# Patient Record
Sex: Male | Born: 1960
Health system: Southern US, Community
[De-identification: ages and names within clinical notes are randomized; demographics above are authoritative.]

## PROBLEM LIST (undated history)

## (undated) DIAGNOSIS — I480 Paroxysmal atrial fibrillation: Secondary | ICD-10-CM

## (undated) HISTORY — DX: Paroxysmal atrial fibrillation: I48.0

---

## 2004-01-29 ENCOUNTER — Ambulatory Visit (HOSPITAL_COMMUNITY): Admission: RE | Admit: 2004-01-29 | Discharge: 2004-01-29 | Payer: Self-pay | Admitting: Orthopedic Surgery

## 2008-05-09 ENCOUNTER — Encounter: Admission: RE | Admit: 2008-05-09 | Discharge: 2008-05-09 | Payer: Self-pay | Admitting: Gastroenterology

## 2009-11-14 IMAGING — CT CT ABD-PELV W/O CM
4 of 7 series · 14 of 32 positions shown, 19 images · IV contrast (CONTRAST)
Comparison: NONE

CLINICAL DATA: Bloating. 

CT ABDOMEN AND PELVIS WITHOUT AND WITH INTRAVENOUS AND FOLLOWING 
ORAL  CONTRAST
TECHNIQUE: Multiple axial images were obtained from the diaphragm 
through the pelvis.

[Series 2: wo · axial · 0.82mm/px · z∈[+886,+966]mm · 2 of 49 slices shown]
[im 17/49  soft-tissue]
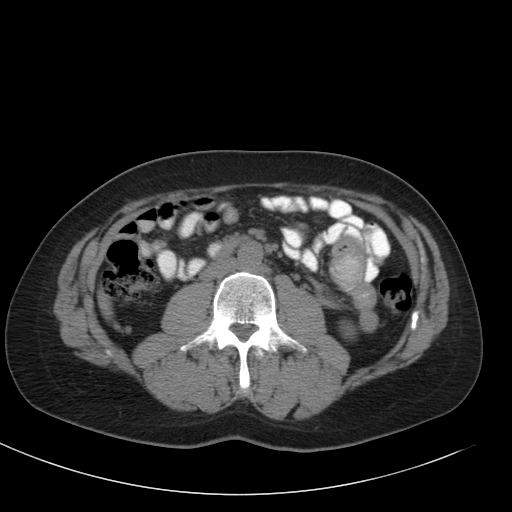
[im 33/49  soft-tissue]
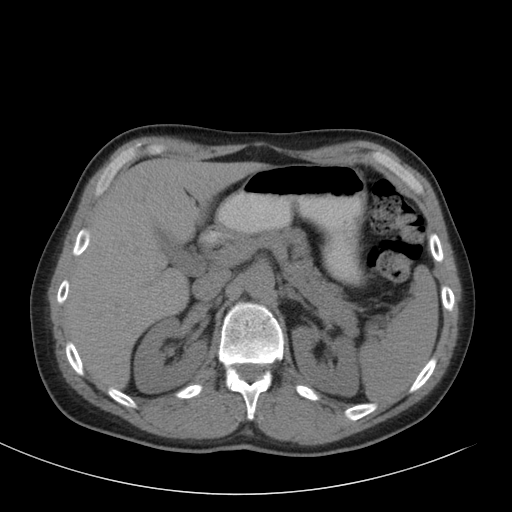

[Series 3: arterial · axial · arterial · 0.83mm/px · z∈[+886,+966]mm · 2 of 49 slices shown]
[im 17/49  soft-tissue]
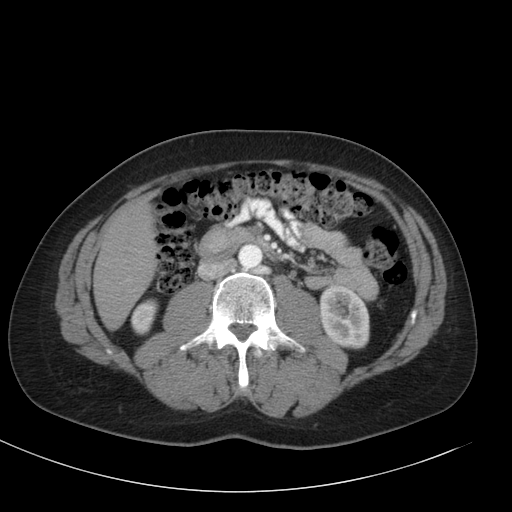
[im 33/49  soft-tissue]
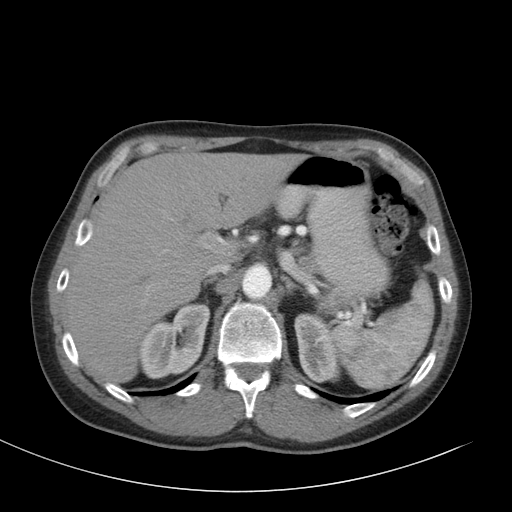

[Series 4: venous · axial · portal-venous · 0.82mm/px · z∈[+651,+976]mm · 6 of 93 slices shown, 11 images]
[im 14/93  soft-tissue]
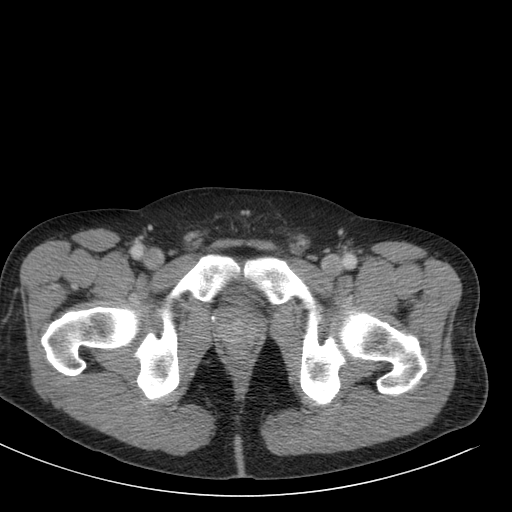
[im 14/93  bone]
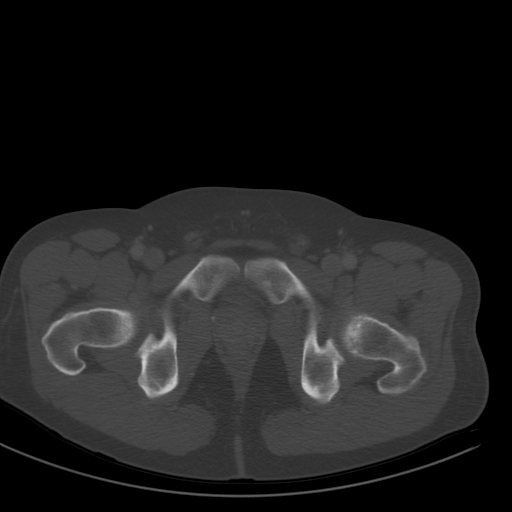
[im 27/93  soft-tissue]
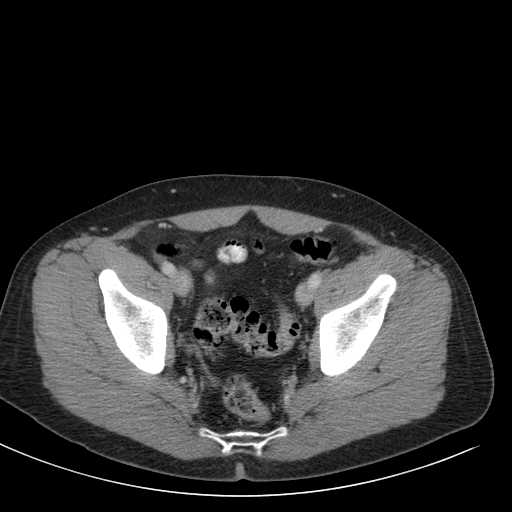
[im 40/93  soft-tissue]
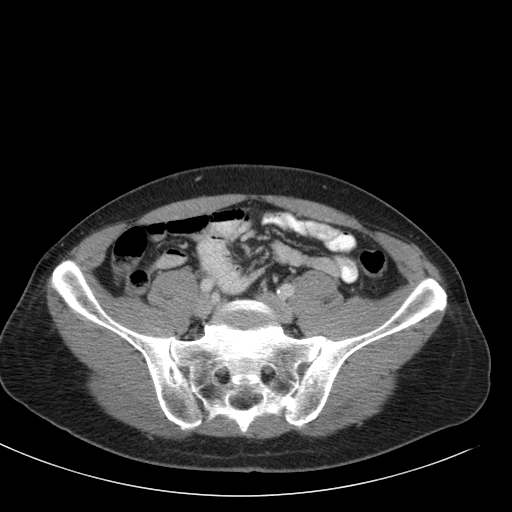
[im 40/93  lung]
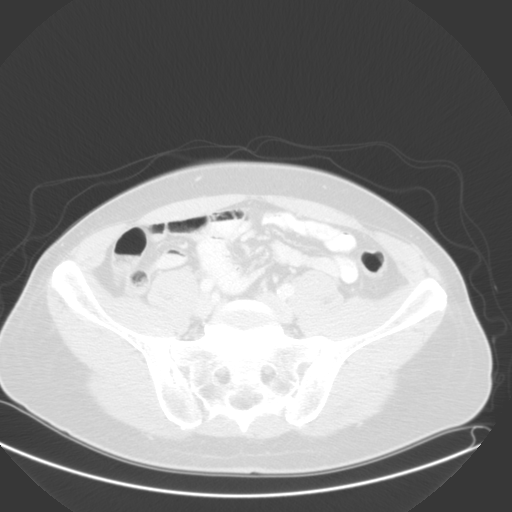
[im 53/93  soft-tissue]
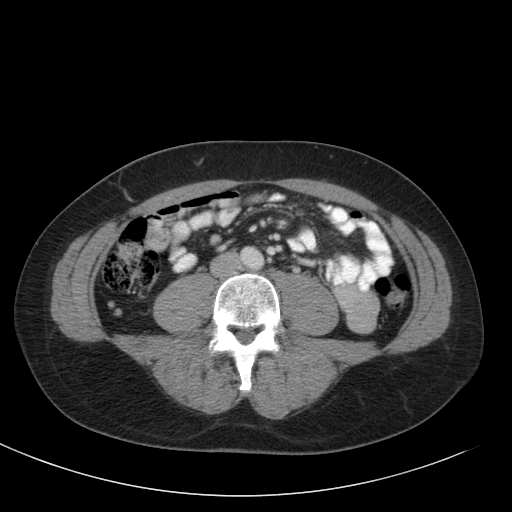
[im 53/93  lung]
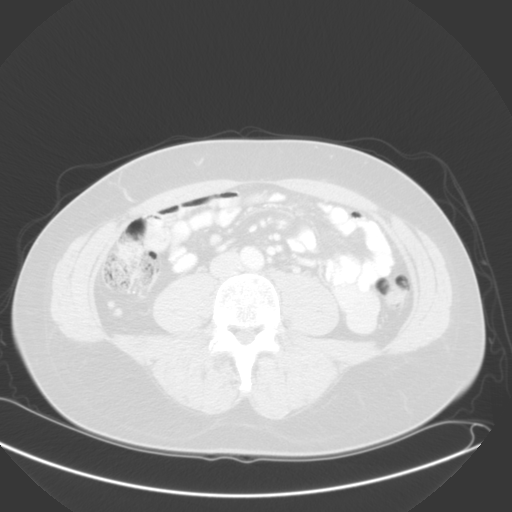
[im 66/93  soft-tissue]
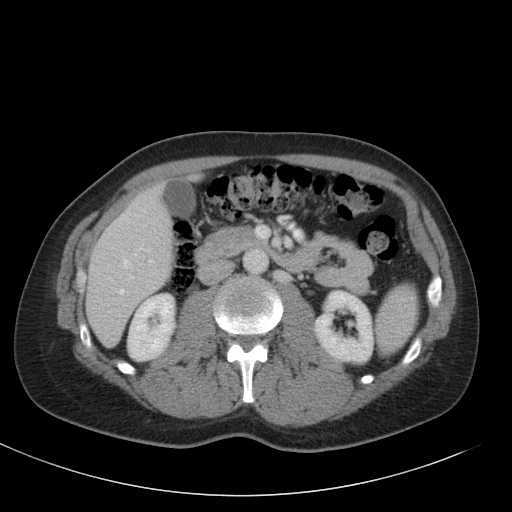
[im 66/93  lung]
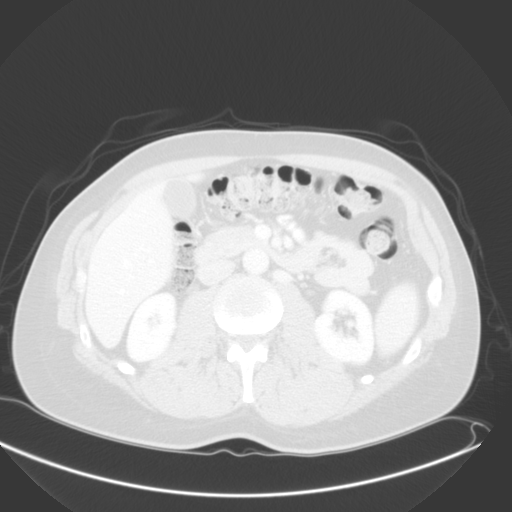
[im 79/93  soft-tissue]
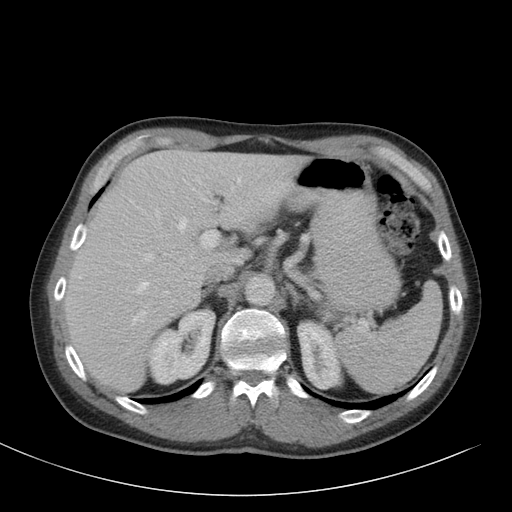
[im 79/93  lung]
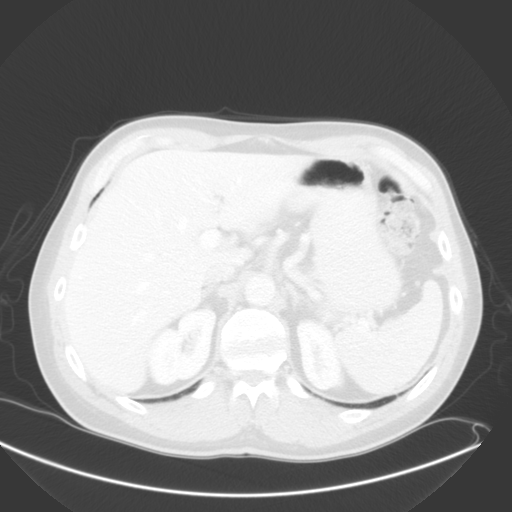

[Series 8: bone · axial · 0.82mm/px · z∈[+651,+846]mm · 4 of 93 slices shown]
[im 14/93  bone]
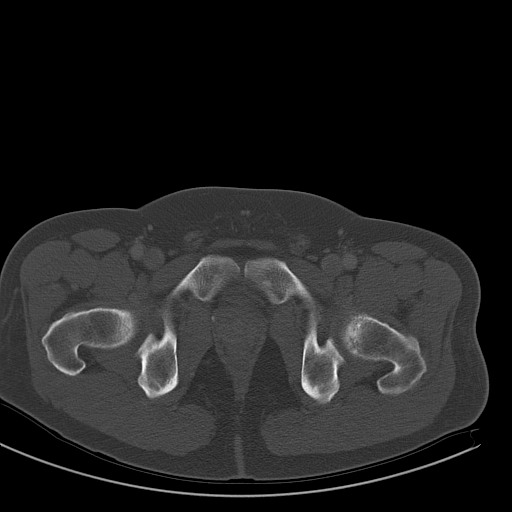
[im 27/93  bone]
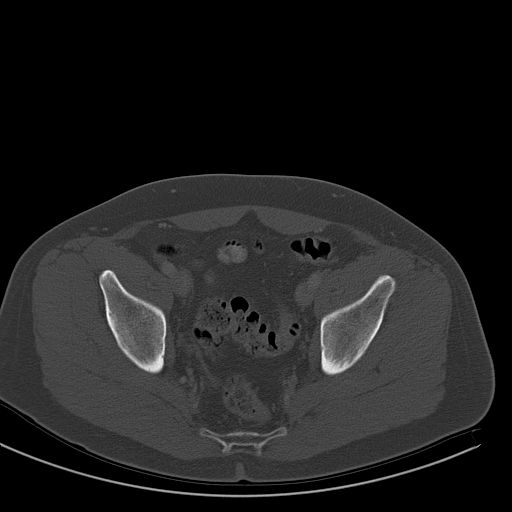
[im 40/93  bone]
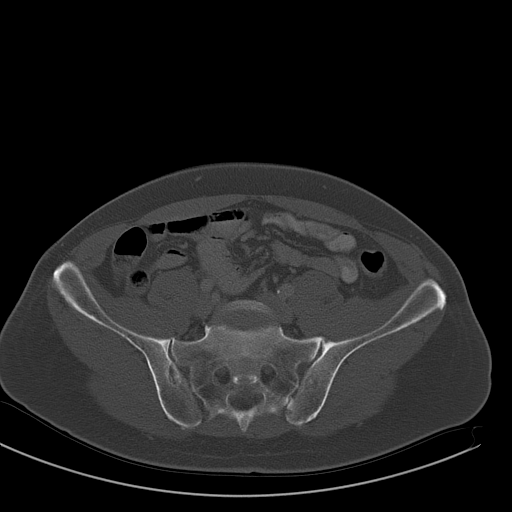
[im 53/93  bone]
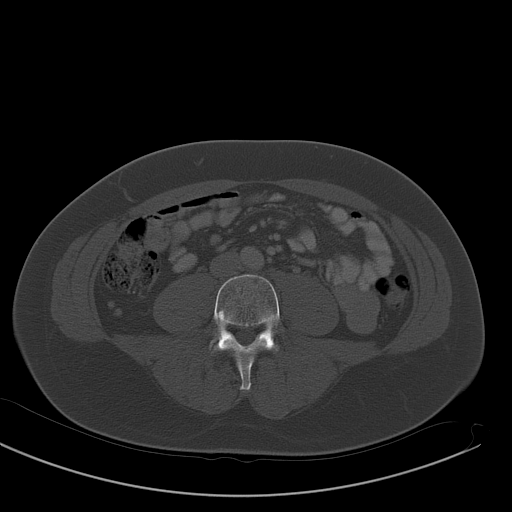

[14 of 32 positions shown; findings below may reference images not displayed]

FINDINGS: The liver, gallbladder, spleen and pancreas are 
unremarkable. There is no evidence of upper abdominal or 
retroperitoneal mass, adenopathy, or aneurysm. No adrenal or renal 
mass or obstruction is noted. No bowel, mesenteric, pelvic, or 
inguinal mass, adenopathy or inflammatory process. No evidence of 
appendicitis, diverticulitis, hernia or bowel obstruction. No lung 
base mass, infiltrate, edema or effusion. No lytic or blastic 
lesions are identified. No evidence of renal or ureteral calculi.
IMPRESSION: Negative CT of the abdomen and pelvis.  No evidence 
of mass, adenopathy or inflammatory process. Renzhe, Jhon Rusly Date:  10/28/2007 DAS  JLM

## 2014-05-22 ENCOUNTER — Encounter: Payer: Self-pay | Admitting: Cardiovascular Disease

## 2014-05-22 ENCOUNTER — Ambulatory Visit (INDEPENDENT_AMBULATORY_CARE_PROVIDER_SITE_OTHER): Payer: BLUE CROSS/BLUE SHIELD | Admitting: Cardiovascular Disease

## 2014-05-22 VITALS — BP 112/74 | HR 82 | Ht 76.0 in | Wt 244.0 lb

## 2014-05-22 DIAGNOSIS — I480 Paroxysmal atrial fibrillation: Secondary | ICD-10-CM | POA: Insufficient documentation

## 2014-05-22 DIAGNOSIS — R0602 Shortness of breath: Secondary | ICD-10-CM

## 2014-05-22 DIAGNOSIS — I4891 Unspecified atrial fibrillation: Secondary | ICD-10-CM

## 2014-05-22 DIAGNOSIS — R0789 Other chest pain: Secondary | ICD-10-CM

## 2014-05-22 HISTORY — DX: Paroxysmal atrial fibrillation: I48.0

## 2014-05-22 NOTE — Patient Instructions (Signed)
Your physician has requested that you have an echocardiogram. Echocardiography is a painless test that uses sound waves to create images of your heart. It provides your doctor with information about the size and shape of your heart and how well your heart's chambers and valves are working. This procedure takes approximately one hour. There are no restrictions for this procedure.  Your physician has requested that you have an exercise tolerance test. For further information please visit [email or web address]. Please also follow instruction sheet, as given.  Dr. Royann Shiversroitoru recommends that you schedule a follow-up appointment in: One year.

## 2014-05-22 NOTE — Progress Notes (Signed)
Patient ID: Kevin [name], male   DOB: 17-Feb-1961, 54 y.o.   MRN: [id number]      Reason for office visit New-onset paroxysmal atrial fibrillation  Kevin [name] is a healthy 54 year old man with recently diagnosed atrial fibrillation. This manifested as rapid uncomfortable palpitations while driving. He first noticed it one week ago. It lasted for less than an hour and recurred 2 days later. His electrocardiogram in Kevin [name] office showed atrial fibrillation with a ventricular rate of 115 bpm but is otherwise normal. He has a long-standing history of very brief palpitations usually lasting less than 2 seconds, and these are the first episodes of more sustained palpitations.  No other cardiac complaints occurred while he was having palpitations.  He has no history of structural heart disease and does not have hypertension, diabetes, a history of stroke/TIA, bleeding problems or, congestive heart failure. He had strep throat a couple of times as a young adult but was treated with antibiotics. He has good functional status. He bears a diagnosis of hyperlipidemia but does not tolerate statins due to headache and stopped taking them. He also bears a diagnosis of hiatal hernia irritable bowel syndrome and asthma.  He does not regularly exercise, but as recently as 6 months ago was riding his mountain bike for up to 10 miles. He drinks alcohol socially, no more than 1-2 beers a night. He did not have any alcohol when he had his episode of prolonged palpitations. He scores only one point on the Epworth sleepiness scale.   His mother had atrial fibrillation when she was 53 years old. He has a brother 80 years old who has frequent palpitations, but has not been formally diagnosed with an arrhythmia.   No Known Allergies  Current Outpatient Prescriptions  Medication Sig Dispense Refill  . aspirin EC 81 MG tablet Take 81 mg by mouth daily.    Marland Kitchen diltiazem (CARDIZEM) 60 MG tablet Take 60 mg by  mouth 3 (three) times daily.  0  . Multiple Vitamin (MULTIVITAMIN WITH MINERALS) TABS tablet Take 1 tablet by mouth daily.     No current facility-administered medications for this visit.    Past Medical History  Diagnosis Date  . Paroxysmal atrial fibrillation 05/22/2014    History reviewed. No pertinent past surgical history.  Family History  Problem Relation Age of Onset  . Heart attack Father 82  . Diabetes Sister     History   Social History  . Marital Status: Married    Spouse Name: N/A  . Number of Children: N/A  . Years of Education: N/A   Occupational History  . Not on file.   Social History Main Topics  . Smoking status: Former Smoker    Types: Cigarettes, Cigars    Quit date: 05/22/1999  . Smokeless tobacco: Not on file  . Alcohol Use: 0.0 oz/week    0 Standard drinks or equivalent per week  . Drug Use: No  . Sexual Activity: Not on file   Other Topics Concern  . Not on file   Social History Narrative  . No narrative on file    Review of systems: The patient specifically denies any chest pain at rest or with exertion, dyspnea at rest or with exertion, orthopnea, paroxysmal nocturnal dyspnea, syncope, focal neurological deficits, intermittent claudication, lower extremity edema, unexplained weight gain, cough, hemoptysis or wheezing.  The patient also denies abdominal pain, nausea, vomiting, dysphagia, diarrhea, constipation, polyuria, polydipsia, dysuria, hematuria, frequency, urgency, abnormal bleeding or bruising, fever,  chills, unexpected weight changes, mood swings, change in skin or hair texture, change in voice quality, auditory or visual problems, allergic reactions or rashes, new musculoskeletal complaints other than usual "aches and pains".   PHYSICAL EXAM BP 112/74 mmHg  Pulse 82  Ht 6\' 4"  (1.93 m)  Wt 244 lb (110.678 kg)  BMI 29.71 kg/m2  General: Alert, oriented x3, no distress Head: no evidence of trauma, PERRL, EOMI, no exophtalmos  or lid lag, no myxedema, no xanthelasma; normal ears, nose and oropharynx Neck: normal jugular venous pulsations and no hepatojugular reflux; brisk carotid pulses without delay and no carotid bruits Chest: clear to auscultation, no signs of consolidation by percussion or palpation, normal fremitus, symmetrical and full respiratory excursions Cardiovascular: normal position and quality of the apical impulse, regular rhythm, normal first and second heart sounds, no murmurs, rubs or gallops. He seems to have a distinct presystolic click - it does not sound like a typical opening snap and there is no accompanying diastolic rumble Abdomen: no tenderness or distention, no masses by palpation, no abnormal pulsatility or arterial bruits, normal bowel sounds, no hepatosplenomegaly Extremities: no clubbing, cyanosis or edema; 2+ radial, ulnar and brachial pulses bilaterally; 2+ right femoral, posterior tibial and dorsalis pedis pulses; 2+ left femoral, posterior tibial and dorsalis pedis pulses; no subclavian or femoral bruits Neurological: grossly nonfocal   EKG: Normal sinus rhythm, normal tracing today, QTC 426 ms; ECG and Dr. Lanell MatarAronson's office clearly shows atrial fibrillation with rapid ventricular response  Lipid Panel  No results found for: CHOL, TRIG, HDL, CHOLHDL, VLDL, LDLCALC, LDLDIRECT  BMET No results found for: NA, K, CL, CO2, GLUCOSE, BUN, CREATININE, CALCIUM, GFRNONAA, GFRAA   ASSESSMENT AND PLAN  New onset atrial fibrillation in a young man without history of heart disease and without obvious evidence of structural abnormalities by physical exam. Normal ECG while in sinus rhythm. Low suspicion for obstructive sleep apnea. There is a family history of atrial fibrillation, but at much more advanced stages. Plan echocardiogram and treadmill stress test. CHADSVasc score = 0. For the time being the treatment with aspirin and diltiazem initiated by Dr. Jacky KindleAronson appears perfectly appropriate. Will  change therapeutic plan accordingly depending on results of stress test and echo. Weight loss may be beneficial to reduce the likelihood of arrhythmia recurrence. Lab results should include thyroid function tests, [facility]  Orders Placed This Encounter  Procedures  . EKG 12-Lead  . Exercise Tolerance Test  . 2D Echocardiogram without contrast   Meds ordered this encounter  Medications  . diltiazem (CARDIZEM) 60 MG tablet    Sig: Take 60 mg by mouth 3 (three) times daily.    Refill:  0  . Multiple Vitamin (MULTIVITAMIN WITH MINERALS) TABS tablet    Sig: Take 1 tablet by mouth daily.  Marland Kitchen. aspirin EC 81 MG tablet    Sig: Take 81 mg by mouth daily.    Junious SilkROITORU,Jacklyn Branan  Walter Min, MD, [facility] [phone] office [phone] pager

## 2014-05-25 ENCOUNTER — Encounter: Payer: Self-pay | Admitting: Cardiovascular Disease

## 2014-05-30 ENCOUNTER — Encounter (HOSPITAL_COMMUNITY): Payer: BLUE CROSS/BLUE SHIELD

## 2014-05-30 ENCOUNTER — Ambulatory Visit (HOSPITAL_COMMUNITY): Payer: BLUE CROSS/BLUE SHIELD

## 2014-06-02 ENCOUNTER — Encounter (HOSPITAL_COMMUNITY): Payer: Self-pay | Admitting: *Deleted

## 2014-06-09 ENCOUNTER — Telehealth (HOSPITAL_COMMUNITY): Payer: Self-pay

## 2014-06-09 NOTE — Telephone Encounter (Signed)
Encounter complete. 

## 2014-06-14 ENCOUNTER — Ambulatory Visit (HOSPITAL_COMMUNITY)
Admission: RE | Admit: 2014-06-14 | Discharge: 2014-06-14 | Disposition: A | Discharge status: Home / Self Care | Payer: BLUE CROSS/BLUE SHIELD | Source: Ambulatory Visit | Attending: Cardiovascular Disease | Admitting: Cardiovascular Disease

## 2014-06-14 ENCOUNTER — Ambulatory Visit (HOSPITAL_BASED_OUTPATIENT_CLINIC_OR_DEPARTMENT_OTHER)
Admission: RE | Admit: 2014-06-14 | Discharge: 2014-06-14 | Disposition: A | Discharge status: Home / Self Care | Payer: BLUE CROSS/BLUE SHIELD | Source: Ambulatory Visit | Attending: Cardiovascular Disease | Admitting: Cardiovascular Disease

## 2014-06-14 DIAGNOSIS — I4891 Unspecified atrial fibrillation: Secondary | ICD-10-CM

## 2014-06-14 DIAGNOSIS — R0600 Dyspnea, unspecified: Secondary | ICD-10-CM | POA: Insufficient documentation

## 2014-06-14 DIAGNOSIS — R0602 Shortness of breath: Secondary | ICD-10-CM | POA: Diagnosis not present

## 2014-06-14 NOTE — Procedures (Signed)
Exercise Treadmill Test Pre-Exercise Testing Evaluation Rhythm: normal sinus                  Test  Exercise Tolerance Test Ordering MD: Thurmon FairMihai Croitoru, MD    Unique Test No: 1  Treadmill:  1  Indication for ETT: exertional dyspnea  Contraindication to ETT: No   Stress Modality: exercise - treadmill  Cardiac Imaging Performed: non   Protocol: standard Bruce - maximal  Max BP:  173/87  Max MPHR (bpm):  168 85% MPR (bpm):  142  MPHR obtained (bpm):  181 % MPHR obtained:  107  Reached 85% MPHR (min:sec):  3:30 Total Exercise Time (min-sec):  9  Workload in METS:  10.1 Borg Scale: 15  Reason ETT Terminated:  THR obtained, SOB and mild leg fatigue    ST Segment Analysis At Rest: normal ST segments - no evidence of significant ST depression With Exercise: no evidence of significant ST depression  Other Information Arrhythmia:  No Angina during ETT:  absent (0) Quality of ETT:  diagnostic  ETT Interpretation:  normal - no evidence of ischemia by ST analysis  Comments: Nl GXT  Recommendations: Follow up with Dr. Royann Shiversroitoru

## 2014-06-14 NOTE — Progress Notes (Signed)
2D Echo Performed 06/14/2014    Eldonna Neuenfeldt, RCS  

## 2014-06-15 ENCOUNTER — Telehealth: Payer: Self-pay | Admitting: Cardiovascular Disease

## 2014-06-15 NOTE — Telephone Encounter (Signed)
Pt called in wanting to know if he is still suppose to take his Cardizem. Please call back

## 2014-06-15 NOTE — Telephone Encounter (Signed)
Spoke with pt, he will cont to take the cardizem per dr croitoru's office note and the echo and GXT were normal.

## 2015-08-08 DIAGNOSIS — Z79899 Other long term (current) drug therapy: Secondary | ICD-10-CM | POA: Diagnosis not present

## 2015-08-08 DIAGNOSIS — R300 Dysuria: Secondary | ICD-10-CM | POA: Diagnosis not present

## 2015-08-28 DIAGNOSIS — S8001XA Contusion of right knee, initial encounter: Secondary | ICD-10-CM | POA: Diagnosis not present

## 2015-10-15 DIAGNOSIS — S83241A Other tear of medial meniscus, current injury, right knee, initial encounter: Secondary | ICD-10-CM | POA: Diagnosis not present

## 2015-10-22 DIAGNOSIS — H5213 Myopia, bilateral: Secondary | ICD-10-CM | POA: Diagnosis not present

## 2015-11-05 DIAGNOSIS — S83241D Other tear of medial meniscus, current injury, right knee, subsequent encounter: Secondary | ICD-10-CM | POA: Diagnosis not present

## 2015-11-12 DIAGNOSIS — S83241D Other tear of medial meniscus, current injury, right knee, subsequent encounter: Secondary | ICD-10-CM | POA: Diagnosis not present

## 2015-11-15 ENCOUNTER — Encounter: Payer: Self-pay | Admitting: Cardiovascular Disease

## 2015-11-15 ENCOUNTER — Telehealth: Payer: Self-pay

## 2015-11-15 NOTE — Telephone Encounter (Signed)
Request for surgical clearance:   1. What type surgery is being performed? Right knee scope  2. When is this surgery scheduled? pending  3. Are there any medications that need to be held prior to surgery and how long? n/a  4. Name of the physician performing surgery: Dr Frederico Hammananiel Caffrey  5. What is the office phone and fax number?   Phone [phone]) [phone]  Fax [phone]

## 2015-11-15 NOTE — Telephone Encounter (Signed)
Letter sent via Epic. Probably time for a follow-up for him as well, not urgent.

## 2015-11-15 NOTE — Telephone Encounter (Signed)
error 

## 2015-11-20 NOTE — Telephone Encounter (Signed)
[facility]

## 2015-12-05 ENCOUNTER — Ambulatory Visit (INDEPENDENT_AMBULATORY_CARE_PROVIDER_SITE_OTHER): Payer: BLUE CROSS/BLUE SHIELD | Admitting: Cardiovascular Disease

## 2015-12-05 ENCOUNTER — Encounter: Payer: Self-pay | Admitting: Cardiovascular Disease

## 2015-12-05 VITALS — BP 129/78 | HR 81 | Ht 76.0 in | Wt 244.8 lb

## 2015-12-05 DIAGNOSIS — E663 Overweight: Secondary | ICD-10-CM | POA: Insufficient documentation

## 2015-12-05 DIAGNOSIS — I480 Paroxysmal atrial fibrillation: Secondary | ICD-10-CM | POA: Diagnosis not present

## 2015-12-05 NOTE — Progress Notes (Signed)
Cardiology Office Note    Date:  12/05/2015   ID:  Kevin [name], DOB March 14, 1961, MRN [id number]  PCP:  Minda Meo, MD  Cardiologist:   Thurmon Fair, MD   Chief Complaint  Patient presents with  . Follow-up    Pt states sob minor, randomly. Pt states no other Sx.     History of Present Illness:  Kevin [name] is a 55 y.o. male with paroxysmal atrial fibrillation with rapid ventricular response. His episodes have been [name] and far between. He estimates he has had 6 or 7 events in the last 2 years and usually each episode only lasts for matter of several seconds. He does not have any evidence of structural heart disease by previous echocardiogram and stress testing. He takes diltiazem for rate control. This medicine is causing a little bit of problem with ankle edema and especially with constipation, although he has difficulty distinguishing the effects of irritable bowel syndrome from medication side effects. On a couple of occasions when he has accidentally skipped the diltiazem he has felt better. Things seem to "flow easier". He denies angina, dyspnea, syncope, focal neurological deficits, visual changes, headaches or claudication. He reports gaining weight since his last appointment but according to our computer his weight is identical.    Past Medical History:  Diagnosis Date  . Paroxysmal atrial fibrillation (HCC) 05/22/2014    No past surgical history  Current Medications: Outpatient Medications Prior to Visit  Medication Sig Dispense Refill  . aspirin EC 81 MG tablet Take 81 mg by mouth daily.    . Multiple Vitamin (MULTIVITAMIN WITH MINERALS) TABS tablet Take 1 tablet by mouth daily.    Marland Kitchen diltiazem (CARDIZEM) 60 MG tablet Take 60 mg by mouth 3 (three) times daily.  0   No facility-administered medications prior to visit.      Allergies:   Review of patient's allergies indicates no known allergies.   Social History   Social History  . Marital status:  Married    Spouse name: N/A  . Number of children: N/A  . Years of education: N/A   Social History Main Topics  . Smoking status: Former Smoker    Types: Cigarettes, Cigars    Quit date: 05/22/1999  . Smokeless tobacco: None  . Alcohol use 0.0 oz/week  . Drug use: No  . Sexual activity: Not Asked   Other Topics Concern  . None   Social History Narrative  . None     Family History:  The patient's family history includes Diabetes in his sister; Heart attack (age of onset: 62) in his father.   ROS:   Please see the history of present illness.    ROS All other systems reviewed and are negative.   PHYSICAL EXAM:   VS:  BP 129/78   Pulse 81   Ht 6\' 4"  (1.93 m)   Wt 244 lb 12.8 oz (111 kg)   BMI 29.80 kg/m    GEN: Well nourished, well developed, in no acute distress  HEENT: normal  Neck: no JVD, carotid bruits, or masses Cardiac: RRR; no murmurs, rubs, or gallops,no edema  Respiratory:  clear to auscultation bilaterally, normal work of breathing GI: soft, nontender, nondistended, + BS MS: no deformity or atrophy  Skin: warm and dry, no rash Neuro:  Alert and Oriented x 3, Strength and sensation are intact Psych: euthymic mood, full affect  Wt Readings from Last 3 Encounters:  12/05/15 244 lb 12.8 oz (111 kg)  05/22/14 244 lb (110.7 kg)      Studies/Labs Reviewed:   EKG:  EKG is ordered today.  The ekg ordered today demonstrates NSR.     ASSESSMENT:    1. Paroxysmal atrial fibrillation (HCC)      PLAN:  In order of problems listed above:  1. AFib: He seems to have "lone atrial fibrillation" and the episodes are [name] and brief. At this point the rate control medication seems to be having more side effects than benefit 9 think he can discontinue it. Since his symptoms are so infrequent and mild antiarrhythmics are also not indicated. CHADSVasc zero. Embolic risk is low, continue aspirin 2. Overweight: Encouraged weight loss. Discussed the direct relationship  between weight and burden of atrial fibrillation. He does not have symptoms suggestive of obstructive sleep apnea    Medication Adjustments/Labs and Tests Ordered: Current medicines are reviewed at length with the patient today.  Concerns regarding medicines are outlined above.  Medication changes, Labs and Tests ordered today are listed in the Patient Instructions below. Patient Instructions  Dr Royann Shiversroitoru has recommended making the following medication changes: 1. STOP Diltiazem  Dr C recommends that you schedule a follow-up appointment in 1 year. You will receive a reminder letter in the mail two months in advance. If you don't receive a letter, please call our office to schedule the follow-up appointment.  If you need a refill on your cardiac medications before your next appointment, please call your pharmacy.    Signed, Thurmon FairMihai Jazzlyn Huizenga, MD  12/05/2015 5:36 PM    [facility] [address], [place], [place]  [address] Phone: [phone]) [phone]; Fax: [phone]) [phone]

## 2015-12-05 NOTE — Patient Instructions (Signed)
Dr Croitoru has recommended making the following medication changes: 1. STOP Diltiazem  Dr C recommends that you schedule a follow-up appointment in 1 year. You will receive a reminder letter in the mail two months in advance. If you don't receive a letter, please call our office to schedule the follow-up appointment.  If you need a refill on your cardiac medications before your next appointment, please call your pharmacy. 

## 2015-12-24 DIAGNOSIS — E784 Other hyperlipidemia: Secondary | ICD-10-CM | POA: Diagnosis not present

## 2015-12-24 DIAGNOSIS — Z125 Encounter for screening for malignant neoplasm of prostate: Secondary | ICD-10-CM | POA: Diagnosis not present

## 2015-12-24 DIAGNOSIS — Z0000 Encounter for general adult medical examination without abnormal findings: Secondary | ICD-10-CM | POA: Diagnosis not present

## 2015-12-31 DIAGNOSIS — Z1389 Encounter for screening for other disorder: Secondary | ICD-10-CM | POA: Diagnosis not present

## 2015-12-31 DIAGNOSIS — Z0000 Encounter for general adult medical examination without abnormal findings: Secondary | ICD-10-CM | POA: Diagnosis not present

## 2015-12-31 DIAGNOSIS — M25561 Pain in right knee: Secondary | ICD-10-CM | POA: Diagnosis not present

## 2015-12-31 DIAGNOSIS — J302 Other seasonal allergic rhinitis: Secondary | ICD-10-CM | POA: Diagnosis not present

## 2015-12-31 DIAGNOSIS — R0789 Other chest pain: Secondary | ICD-10-CM | POA: Diagnosis not present

## 2015-12-31 DIAGNOSIS — M545 Low back pain: Secondary | ICD-10-CM | POA: Diagnosis not present

## 2016-01-03 DIAGNOSIS — Z1212 Encounter for screening for malignant neoplasm of rectum: Secondary | ICD-10-CM | POA: Diagnosis not present

## 2016-01-16 DIAGNOSIS — L821 Other seborrheic keratosis: Secondary | ICD-10-CM | POA: Diagnosis not present

## 2016-01-16 DIAGNOSIS — D485 Neoplasm of uncertain behavior of skin: Secondary | ICD-10-CM | POA: Diagnosis not present

## 2016-01-16 DIAGNOSIS — D1801 Hemangioma of skin and subcutaneous tissue: Secondary | ICD-10-CM | POA: Diagnosis not present

## 2016-01-16 DIAGNOSIS — D225 Melanocytic nevi of trunk: Secondary | ICD-10-CM | POA: Diagnosis not present

## 2016-06-18 DIAGNOSIS — L821 Other seborrheic keratosis: Secondary | ICD-10-CM | POA: Diagnosis not present

## 2016-06-18 DIAGNOSIS — D1721 Benign lipomatous neoplasm of skin and subcutaneous tissue of right arm: Secondary | ICD-10-CM | POA: Diagnosis not present

## 2016-06-18 DIAGNOSIS — B353 Tinea pedis: Secondary | ICD-10-CM | POA: Diagnosis not present

## 2016-06-18 DIAGNOSIS — L309 Dermatitis, unspecified: Secondary | ICD-10-CM | POA: Diagnosis not present

## 2016-12-08 ENCOUNTER — Encounter: Payer: Self-pay | Admitting: Cardiovascular Disease

## 2016-12-08 ENCOUNTER — Ambulatory Visit (INDEPENDENT_AMBULATORY_CARE_PROVIDER_SITE_OTHER): Payer: BLUE CROSS/BLUE SHIELD | Admitting: Cardiovascular Disease

## 2016-12-08 VITALS — BP 112/86 | HR 71 | Ht 76.0 in | Wt 246.0 lb

## 2016-12-08 DIAGNOSIS — E663 Overweight: Secondary | ICD-10-CM | POA: Diagnosis not present

## 2016-12-08 DIAGNOSIS — I480 Paroxysmal atrial fibrillation: Secondary | ICD-10-CM

## 2016-12-08 NOTE — Patient Instructions (Signed)
Your physician recommends that you schedule a follow-up appointment in: 12 months.  

## 2016-12-08 NOTE — Progress Notes (Signed)
Cardiology Office Note    Date:  12/08/2016   ID:  Kevin [name], DOB 21-Dec-1960, MRN [id number]  PCP:  Geoffry Paradise, MD  Cardiologist:   Thurmon Fair, MD   Chief Complaint  Patient presents with  . Follow-up    no chest pain, shortness of breath, edema, pain or cramping in legs, lightheaded or dizziness    History of Present Illness:  Kevin [name] is a 56 y.o. male with paroxysmal atrial fibrillation with rapid ventricular response. His episodes have been [name] and far between. He estimates he has had 6 or 7 events in the last 2 years and usually each episode only lasts for matter of several seconds. He does not have any evidence of structural heart disease by previous echocardiogram and stress testing. He takes diltiazem for rate control. This medicine is causing a little bit of problem with ankle edema and especially with constipation, although he has difficulty distinguishing the effects of irritable bowel syndrome from medication side effects. On a couple of occasions when he has accidentally skipped the diltiazem he has felt better. Things seem to "flow easier". He denies angina, dyspnea, syncope, focal neurological deficits, visual changes, headaches or claudication. He reports gaining weight since his last appointment but according to our computer his weight is identical.    Past Medical History:  Diagnosis Date  . Paroxysmal atrial fibrillation (HCC) 05/22/2014    No past surgical history  Current Medications: Outpatient Medications Prior to Visit  Medication Sig Dispense Refill  . aspirin EC 81 MG tablet Take 81 mg by mouth daily.    . Multiple Vitamin (MULTIVITAMIN WITH MINERALS) TABS tablet Take 1 tablet by mouth daily.     No facility-administered medications prior to visit.      Allergies:   Patient has no known allergies.   Social History   Social History  . Marital status: Married    Spouse name: N/A  . Number of children: N/A  . Years of  education: N/A   Social History Main Topics  . Smoking status: Former Smoker    Types: Cigarettes, Cigars    Quit date: 05/22/1999  . Smokeless tobacco: Never Used  . Alcohol use 0.0 oz/week  . Drug use: No  . Sexual activity: Not Asked   Other Topics Concern  . None   Social History Narrative  . None     Family History:  The patient's family history includes Diabetes in his sister; Heart attack (age of onset: 57) in his father.   ROS:   Please see the history of present illness.    ROS All other systems reviewed and are negative.   PHYSICAL EXAM:   VS:  Ht  (1.93 m)   Wt 246 lb (111.6 kg)   BMI 29.94 kg/m    General: Alert, oriented x3, no distress. Borderline obese Head: no evidence of trauma, PERRL, EOMI, no exophtalmos or lid lag, no myxedema, no xanthelasma; normal ears, nose and oropharynx Neck: normal jugular venous pulsations and no hepatojugular reflux; brisk carotid pulses without delay and no carotid bruits Chest: clear to auscultation, no signs of consolidation by percussion or palpation, normal fremitus, symmetrical and full respiratory excursions Cardiovascular: normal position and quality of the apical impulse, regular rhythm, normal first and second heart sounds, no murmurs, rubs or gallops Abdomen: no tenderness or distention, no masses by palpation, no abnormal pulsatility or arterial bruits, normal bowel sounds, no hepatosplenomegaly Extremities: no clubbing, cyanosis or edema; 2+ radial,  ulnar and brachial pulses bilaterally; 2+ right femoral, posterior tibial and dorsalis pedis pulses; 2+ left femoral, posterior tibial and dorsalis pedis pulses; no subclavian or femoral bruits Neurological: grossly nonfocal Psych: euthymic mood, full affect  Wt Readings from Last 3 Encounters:  12/08/16 246 lb (111.6 kg)  12/05/15 244 lb 12.8 oz (111 kg)  05/22/14 244 lb (110.7 kg)      Studies/Labs Reviewed:   EKG:  EKG is ordered today.  The ekg ordered  today demonstrates Sinus rhythm, normal tracing, QTC 402 ms.  ASSESSMENT:    No diagnosis found.   PLAN:  In order of problems listed above:  1. AFib: [name] has "lone atrial fibrillation" with very infrequent and brief events. He does better without any medications for rate control and definite does not require antiarrhythmics. CHADSVasc zero, low embolic risk 2. Overweight: Borderline obese. Encouraged weight loss. Discussed the direct relationship between weight and burden of atrial fibrillation. He does not have symptoms suggestive of obstructive sleep apnea    Medication Adjustments/Labs and Tests Ordered: Current medicines are reviewed at length with the patient today.  Concerns regarding medicines are outlined above.  Medication changes, Labs and Tests ordered today are listed in the Patient Instructions below. There are no Patient Instructions on file for this visit.   Signed, Thurmon Fair, MD  12/08/2016 8:47 AM    [facility] [address] [place], [place], [place]  [address] Phone: [phone]; Fax: [phone]

## 2016-12-22 DIAGNOSIS — H524 Presbyopia: Secondary | ICD-10-CM | POA: Diagnosis not present

## 2016-12-22 DIAGNOSIS — H2513 Age-related nuclear cataract, bilateral: Secondary | ICD-10-CM | POA: Diagnosis not present

## 2016-12-29 DIAGNOSIS — E7849 Other hyperlipidemia: Secondary | ICD-10-CM | POA: Diagnosis not present

## 2016-12-29 DIAGNOSIS — Z0000 Encounter for general adult medical examination without abnormal findings: Secondary | ICD-10-CM | POA: Diagnosis not present

## 2016-12-29 DIAGNOSIS — Z125 Encounter for screening for malignant neoplasm of prostate: Secondary | ICD-10-CM | POA: Diagnosis not present

## 2017-01-05 DIAGNOSIS — K589 Irritable bowel syndrome without diarrhea: Secondary | ICD-10-CM | POA: Diagnosis not present

## 2017-01-05 DIAGNOSIS — I4891 Unspecified atrial fibrillation: Secondary | ICD-10-CM | POA: Diagnosis not present

## 2017-01-05 DIAGNOSIS — Z0000 Encounter for general adult medical examination without abnormal findings: Secondary | ICD-10-CM | POA: Diagnosis not present

## 2017-01-05 DIAGNOSIS — Z1389 Encounter for screening for other disorder: Secondary | ICD-10-CM | POA: Diagnosis not present

## 2017-01-05 DIAGNOSIS — E7849 Other hyperlipidemia: Secondary | ICD-10-CM | POA: Diagnosis not present

## 2017-01-05 DIAGNOSIS — J45998 Other asthma: Secondary | ICD-10-CM | POA: Diagnosis not present

## 2017-01-09 DIAGNOSIS — Z1212 Encounter for screening for malignant neoplasm of rectum: Secondary | ICD-10-CM | POA: Diagnosis not present

## 2017-03-04 DIAGNOSIS — D2262 Melanocytic nevi of left upper limb, including shoulder: Secondary | ICD-10-CM | POA: Diagnosis not present

## 2017-03-04 DIAGNOSIS — L821 Other seborrheic keratosis: Secondary | ICD-10-CM | POA: Diagnosis not present

## 2017-03-04 DIAGNOSIS — B078 Other viral warts: Secondary | ICD-10-CM | POA: Diagnosis not present

## 2017-03-04 DIAGNOSIS — D485 Neoplasm of uncertain behavior of skin: Secondary | ICD-10-CM | POA: Diagnosis not present

## 2017-03-04 DIAGNOSIS — D1801 Hemangioma of skin and subcutaneous tissue: Secondary | ICD-10-CM | POA: Diagnosis not present

## 2017-03-04 DIAGNOSIS — L570 Actinic keratosis: Secondary | ICD-10-CM | POA: Diagnosis not present

## 2017-03-04 DIAGNOSIS — D225 Melanocytic nevi of trunk: Secondary | ICD-10-CM | POA: Diagnosis not present

## 2017-07-06 DIAGNOSIS — K589 Irritable bowel syndrome without diarrhea: Secondary | ICD-10-CM | POA: Diagnosis not present

## 2017-07-06 DIAGNOSIS — E7849 Other hyperlipidemia: Secondary | ICD-10-CM | POA: Diagnosis not present

## 2017-07-06 DIAGNOSIS — J45909 Unspecified asthma, uncomplicated: Secondary | ICD-10-CM | POA: Diagnosis not present

## 2017-07-06 DIAGNOSIS — I4891 Unspecified atrial fibrillation: Secondary | ICD-10-CM | POA: Diagnosis not present

## 2018-01-05 DIAGNOSIS — E7849 Other hyperlipidemia: Secondary | ICD-10-CM | POA: Diagnosis not present

## 2018-01-05 DIAGNOSIS — R82998 Other abnormal findings in urine: Secondary | ICD-10-CM | POA: Diagnosis not present

## 2018-01-05 DIAGNOSIS — Z0000 Encounter for general adult medical examination without abnormal findings: Secondary | ICD-10-CM | POA: Diagnosis not present

## 2018-01-05 DIAGNOSIS — Z125 Encounter for screening for malignant neoplasm of prostate: Secondary | ICD-10-CM | POA: Diagnosis not present

## 2018-01-11 DIAGNOSIS — Z1389 Encounter for screening for other disorder: Secondary | ICD-10-CM | POA: Diagnosis not present

## 2018-01-11 DIAGNOSIS — Z0000 Encounter for general adult medical examination without abnormal findings: Secondary | ICD-10-CM | POA: Diagnosis not present

## 2018-01-11 DIAGNOSIS — I4891 Unspecified atrial fibrillation: Secondary | ICD-10-CM | POA: Diagnosis not present

## 2018-01-11 DIAGNOSIS — E7849 Other hyperlipidemia: Secondary | ICD-10-CM | POA: Diagnosis not present

## 2018-01-11 DIAGNOSIS — M545 Low back pain: Secondary | ICD-10-CM | POA: Diagnosis not present

## 2018-01-11 DIAGNOSIS — K589 Irritable bowel syndrome without diarrhea: Secondary | ICD-10-CM | POA: Diagnosis not present

## 2018-01-11 DIAGNOSIS — Z1212 Encounter for screening for malignant neoplasm of rectum: Secondary | ICD-10-CM | POA: Diagnosis not present

## 2018-01-26 DIAGNOSIS — Z1212 Encounter for screening for malignant neoplasm of rectum: Secondary | ICD-10-CM | POA: Diagnosis not present

## 2018-01-26 DIAGNOSIS — Z1211 Encounter for screening for malignant neoplasm of colon: Secondary | ICD-10-CM | POA: Diagnosis not present

## 2018-03-03 DIAGNOSIS — D2262 Melanocytic nevi of left upper limb, including shoulder: Secondary | ICD-10-CM | POA: Diagnosis not present

## 2018-03-03 DIAGNOSIS — D225 Melanocytic nevi of trunk: Secondary | ICD-10-CM | POA: Diagnosis not present

## 2018-03-03 DIAGNOSIS — L821 Other seborrheic keratosis: Secondary | ICD-10-CM | POA: Diagnosis not present

## 2018-03-03 DIAGNOSIS — L814 Other melanin hyperpigmentation: Secondary | ICD-10-CM | POA: Diagnosis not present

## 2018-05-24 DIAGNOSIS — H2513 Age-related nuclear cataract, bilateral: Secondary | ICD-10-CM | POA: Diagnosis not present

## 2018-06-07 ENCOUNTER — Emergency Department (HOSPITAL_BASED_OUTPATIENT_CLINIC_OR_DEPARTMENT_OTHER): Payer: BLUE CROSS/BLUE SHIELD

## 2018-06-07 ENCOUNTER — Other Ambulatory Visit: Payer: Self-pay

## 2018-06-07 ENCOUNTER — Encounter (HOSPITAL_BASED_OUTPATIENT_CLINIC_OR_DEPARTMENT_OTHER): Payer: Self-pay | Admitting: Emergency Medicine

## 2018-06-07 ENCOUNTER — Emergency Department (HOSPITAL_BASED_OUTPATIENT_CLINIC_OR_DEPARTMENT_OTHER)
Admission: EM | Admit: 2018-06-07 | Discharge: 2018-06-07 | Disposition: A | Discharge status: Home / Self Care | Payer: BLUE CROSS/BLUE SHIELD | Attending: Emergency Medicine | Admitting: Emergency Medicine

## 2018-06-07 DIAGNOSIS — N23 Unspecified renal colic: Secondary | ICD-10-CM | POA: Diagnosis not present

## 2018-06-07 DIAGNOSIS — Z87891 Personal history of nicotine dependence: Secondary | ICD-10-CM | POA: Diagnosis not present

## 2018-06-07 DIAGNOSIS — R1030 Lower abdominal pain, unspecified: Secondary | ICD-10-CM | POA: Diagnosis not present

## 2018-06-07 DIAGNOSIS — Z79899 Other long term (current) drug therapy: Secondary | ICD-10-CM | POA: Diagnosis not present

## 2018-06-07 DIAGNOSIS — N281 Cyst of kidney, acquired: Secondary | ICD-10-CM | POA: Insufficient documentation

## 2018-06-07 DIAGNOSIS — R109 Unspecified abdominal pain: Secondary | ICD-10-CM | POA: Diagnosis present

## 2018-06-07 DIAGNOSIS — N201 Calculus of ureter: Secondary | ICD-10-CM | POA: Diagnosis not present

## 2018-06-07 LAB — CBC
HCT: 51.6 % (ref 39.0–52.0)
Hemoglobin: 16.8 g/dL (ref 13.0–17.0)
MCH: 29.6 pg (ref 26.0–34.0)
MCHC: 32.6 g/dL (ref 30.0–36.0)
MCV: 91 fL (ref 80.0–100.0)
Platelets: 243 10*3/uL (ref 150–400)
RBC: 5.67 MIL/uL (ref 4.22–5.81)
RDW: 13.2 % (ref 11.5–15.5)
WBC: 6.1 10*3/uL (ref 4.0–10.5)
nRBC: 0 % (ref 0.0–0.2)

## 2018-06-07 LAB — COMPREHENSIVE METABOLIC PANEL
ALT: 26 U/L (ref 0–44)
AST: 24 U/L (ref 15–41)
Albumin: 4.3 g/dL (ref 3.5–5.0)
Alkaline Phosphatase: 72 U/L (ref 38–126)
Anion gap: 12 (ref 5–15)
BUN: 19 mg/dL (ref 6–20)
CO2: 21 mmol/L — AB (ref 22–32)
Calcium: 9 mg/dL (ref 8.9–10.3)
Chloride: 105 mmol/L (ref 98–111)
Creatinine, Ser: 1.13 mg/dL (ref 0.61–1.24)
GFR calc Af Amer: >60 mL/min (ref >60)
GFR calc non Af Amer: >60 mL/min (ref >60)
Glucose, Bld: 134 mg/dL — ABNORMAL HIGH (ref 70–99)
Potassium: 3.8 mmol/L (ref 3.5–5.1)
Sodium: 138 mmol/L (ref 135–145)
Total Bilirubin: 1.1 mg/dL (ref 0.3–1.2)
Total Protein: 7.1 g/dL (ref 6.5–8.1)

## 2018-06-07 LAB — URINALYSIS, ROUTINE W REFLEX MICROSCOPIC
GLUCOSE, UA: NEGATIVE mg/dL
Ketones, ur: 15 mg/dL — AB
Leukocytes,Ua: NEGATIVE
Nitrite: NEGATIVE
PROTEIN: NEGATIVE mg/dL
Specific Gravity, Urine: >1.03 — ABNORMAL HIGH (ref 1.005–1.030)
pH: 5.5 (ref 5.0–8.0)

## 2018-06-07 LAB — URINALYSIS, MICROSCOPIC (REFLEX): RBC / HPF: >50 RBC/hpf (ref 0–5)

## 2018-06-07 LAB — LIPASE, BLOOD: Lipase: 21 U/L (ref 11–51)

## 2018-06-07 MED ORDER — TAMSULOSIN HCL 0.4 MG PO CAPS: 0.4000 mg | ORAL_CAPSULE | Freq: Every day | ORAL | 0 refills | Status: AC

## 2018-06-07 MED ORDER — OXYCODONE-ACETAMINOPHEN 5-325 MG PO TABS: 1.0000 | ORAL_TABLET | ORAL | 0 refills | Status: AC | PRN

## 2018-06-07 MED FILL — OXYCODONE-ACETAMINOPHEN 5-3: 5-325 | 1 days supply | Qty: 5 | Fill #0

## 2018-06-07 MED FILL — TAMSULOSIN HCL 0.4 MG CAP: 0.4 | 7 days supply | Qty: 7 | Fill #0

## 2018-06-07 NOTE — ED Notes (Signed)
Patient transported to CT 

## 2018-06-07 NOTE — Discharge Instructions (Signed)
Please read and follow all provided instructions.  Your diagnoses today include:  1. Ureteral colic   2. Renal cyst     Tests performed today include:  Urine test that showed blood in your urine and no infection  CT scan which showed a 3 millimeter kidney on the left side and renal cyst that appears benign.   Blood test that showed normal kidney function  Vital signs. See below for your results today.   Medications prescribed:   Percocet (oxycodone/acetaminophen) - narcotic pain medication  DO NOT drive or perform any activities that require you to be awake and alert because this medicine can make you drowsy. BE VERY CAREFUL not to take multiple medicines containing Tylenol (also called acetaminophen). Doing so can lead to an overdose which can damage your liver and cause liver failure and possibly death.   Flomax (tamsulosin) - relaxes smooth muscle to help kidney stones pass  Take any prescribed medications only as directed.  Home care instructions:  Follow any educational materials contained in this packet.  Please double your fluid intake for the next several days. Strain your urine and save any stones that may pass.   BE VERY CAREFUL not to take multiple medicines containing Tylenol (also called acetaminophen). Doing so can lead to an overdose which can damage your liver and cause liver failure and possibly death.   Follow-up instructions: Please follow-up with your urologist or the urologist referral (provided on front page) in the next 1 week for further evaluation of your symptoms.  Return instructions:   Please return to the Emergency Department if you experience worsening symptoms.  Please return if you develop fever or uncontrolled pain or vomiting.  Please return if you have any other emergent concerns.  Additional Information:  Your vital signs today were: BP (!) 142/95    Pulse 68    Temp 97.7 F (36.5 C) (Oral)    Resp 18    Ht 6\' 4"  (1.93 m)    Wt  111.1 kg    SpO2 100%    BMI 29.82 kg/m  If your blood pressure (BP) was elevated above 135/85 this visit, please have this repeated by your doctor within one month. --------------

## 2018-06-07 NOTE — ED Provider Notes (Signed)
[facility] EMERGENCY DEPARTMENT Provider Note   CSN: [id number] Arrival date & time: 06/07/18  1403    History   Chief Complaint Chief Complaint  Patient presents with  . Abdominal Pain    HPI Kevin [name] is a 58 y.o. male.     Patient with history of paroxysmal A. fib not on anticoagulation, no previous abdominal surgeries presents the emergency department with intermittent right sided abdominal pain over the past week.  Patient has had severe sharp episodes of pain in the right lower quadrant and right lower flank areas.  Symptoms last for 1 to 2 hours and are improved by pushing on the area and by lying down.  Patient does reports recent heavy lifting however has not noted any bulges or any other hernia-like symptoms.  He has a history of irritable bowel syndrome, last bowel movement was today and was normal for him.  No chest pain or shortness of breath.  No hematuria or dysuria.  He has noticed some hesitancy at times.  No previous history of kidney stones.  More recently pain has radiated down towards the scrotum.  No swelling in that area noted.  Symptoms were 10 out of 10 just prior to arrival for approximately 1 hour, now improved to 2 out of 10 spontaneously.     Past Medical History:  Diagnosis Date  . Paroxysmal atrial fibrillation (HCC) 05/22/2014    Patient Active Problem List   Diagnosis Date Noted  . Overweight (BMI 25.0-29.9) 12/05/2015  . Paroxysmal atrial fibrillation (HCC) 05/22/2014  . Atypical chest pain 05/22/2014    History reviewed. No pertinent surgical history.      Home Medications    Prior to Admission medications   Medication Sig Start Date End Date Taking? Authorizing Provider  rosuvastatin (CRESTOR) 10 MG tablet TK 1 T PO WEEKLY 05/26/18   [provider]    Family History Family History  Problem Relation Age of Onset  . Heart attack Father 60  . Diabetes Sister     Social History Social History    Tobacco Use  . Smoking status: Former Smoker    Types: Cigarettes, Cigars    Last attempt to quit: 05/22/1999    Years since quitting: 19.0  . Smokeless tobacco: Never Used  Substance Use Topics  . Alcohol use: Yes    Alcohol/week: 0.0 standard drinks  . Drug use: No     Allergies   Patient has no known allergies.   Review of Systems Review of Systems  Constitutional: Negative for fever.  HENT: Negative for rhinorrhea and sore throat.   Eyes: Negative for redness.  Respiratory: Negative for cough.   Cardiovascular: Negative for chest pain.  Gastrointestinal: Positive for abdominal pain. Negative for diarrhea, nausea and vomiting.  Genitourinary: Positive for difficulty urinating and flank pain. Negative for dysuria, hematuria, scrotal swelling and urgency.  Musculoskeletal: Negative for myalgias.  Skin: Negative for rash.  Neurological: Negative for headaches.     Physical Exam Updated Vital Signs BP (!) 142/95   Pulse 68   Temp 97.7 F (36.5 C) (Oral)   Resp 18   Ht  (1.93 m)   Wt 111.1 kg   SpO2 100%   BMI 29.82 kg/m   Physical Exam Vitals signs and nursing note reviewed.  Constitutional:      Appearance: He is well-developed.  HENT:     Head: Normocephalic and atraumatic.  Eyes:     General:  Right eye: No discharge.        Left eye: No discharge.     Conjunctiva/sclera: Conjunctivae normal.  Neck:     Musculoskeletal: Normal range of motion and neck supple.  Cardiovascular:     Rate and Rhythm: Normal rate and regular rhythm.     Heart sounds: Normal heart sounds.  Pulmonary:     Effort: Pulmonary effort is normal.     Breath sounds: Normal breath sounds.  Abdominal:     Palpations: Abdomen is soft.     Tenderness: There is no abdominal tenderness. There is no right CVA tenderness, left CVA tenderness, guarding or rebound. Negative signs include Murphy's sign and Rovsing's sign.  Skin:    General: Skin is warm and dry.  Neurological:      Mental Status: He is alert.      ED Treatments / Results  Labs (all labs ordered are listed, but only abnormal results are displayed) Labs Reviewed  COMPREHENSIVE METABOLIC PANEL - Abnormal; Notable for the following components:      Result Value   CO2 21 (*)    Glucose, Bld 134 (*)    All other components within normal limits  URINALYSIS, ROUTINE W REFLEX MICROSCOPIC - Abnormal; Notable for the following components:   Color, Urine AMBER (*)    Specific Gravity, Urine >1.030 (*)    Hgb urine dipstick LARGE (*)    Bilirubin Urine SMALL (*)    Ketones, ur 15 (*)    All other components within normal limits  URINALYSIS, MICROSCOPIC (REFLEX) - Abnormal; Notable for the following components:   Bacteria, UA FEW (*)    All other components within normal limits  LIPASE, BLOOD  CBC    EKG EKG Interpretation  Date/Time:  Monday June 07 2018 14:10:57 EDT Ventricular Rate:  66 PR Interval:    QRS Duration: 97 QT Interval:  414 QTC Calculation: 434 R Axis:   63 Text Interpretation:  Sinus rhythm No old tracing to compare Confirmed by Azalia Bilis ([id number]) on 06/07/2018 3:08:31 PM   Radiology Ct Renal Stone Study  Result Date: 06/07/2018 CLINICAL DATA:  Right lower quadrant pain for 1 week EXAM: CT ABDOMEN AND PELVIS WITHOUT CONTRAST TECHNIQUE: Multidetector CT imaging of the abdomen and pelvis was performed following the standard protocol without IV contrast. COMPARISON:  10/27/2007 FINDINGS: Lower chest: No acute abnormality. Hepatobiliary: No focal liver abnormality is seen. No gallstones, gallbladder wall thickening, or biliary dilatation. Pancreas: Unremarkable. No pancreatic ductal dilatation or surrounding inflammatory changes. Spleen: Normal in size without focal abnormality. Adrenals/Urinary Tract: Adrenal glands are unremarkable. No obstructive uropathy. 3 mm distal right ureteral calculus. 10 mm hypodense, fluid attenuating left renal mass most consistent with a small  cyst. Bladder is unremarkable. Stomach/Bowel: Stomach is within normal limits. Appendix appears normal. No evidence of bowel wall thickening, distention, or inflammatory changes. Vascular/Lymphatic: No significant vascular findings are present. No enlarged abdominal or pelvic lymph nodes. Reproductive: Prostate is unremarkable. Other: No abdominal wall hernia or abnormality. No abdominopelvic ascites. Musculoskeletal: No acute or significant osseous findings. IMPRESSION: 1. 3 mm distal right ureteral calculus without obstructive uropathy. Electronically Signed   By: Elige Ko   On: 06/07/2018 15:11    Procedures Procedures (including critical care time)  Medications Ordered in ED Medications - No data to display   Initial Impression / Assessment and Plan / ED Course  I have reviewed the triage vital signs and the nursing notes.  Pertinent labs & imaging results that  were available during my care of the patient were reviewed by me and considered in my medical decision making (see chart for details).        Patient seen and examined.  Patient is essentially asymptomatic at time of exam.  Will proceed with renal protocol CT to evaluate for right-sided kidney stone.  This will also help rule any complications due to hernia.  Low concern for appendicitis.  Labs and urine pending as well.  Patient agrees to plan.  Vital signs reviewed and are as follows: BP (!) 142/95   Pulse 68   Temp 97.7 F (36.5 C) (Oral)   Resp 18   Ht 6\' 4"  (1.93 m)   Wt 111.1 kg   SpO2 100%   BMI 29.82 kg/m   3:31 PM CT images reviewed by myself.  Patient updated on results.  Pending UA at this time.  Pain remains controlled.  Patient counseled on kidney stone treatment. Urged patient to strain urine and save any stones. Counseled patient to maintain good fluid intake.   Counseled patient on use of Flomax.   Patient counseled on use of narcotic pain medications. Counseled not to combine these medications  with others containing tylenol. Urged not to drink alcohol, drive, or perform any other activities that requires focus while taking these medications. The patient verbalizes understanding and agrees with the plan.  3:46 PM UA without infection. Cleared for home.    Final Clinical Impressions(s) / ED Diagnoses   Final diagnoses:  Ureteral colic  Renal cyst   Patient with intermittent flank pain over the past week.  Symptoms most consistent with ureteral colic.  CT demonstrates 3 mm distal ureteral stone and a renal cyst.  Otherwise negative.  UA without infection.  Normal kidney function.  Pain is controlled and near resolved at time of discharge.  Plan and follow-up as above.  ED Discharge Orders         Ordered    oxyCODONE-acetaminophen (PERCOCET/ROXICET) 5-325 MG tablet  Every 4 hours PRN     06/07/18 1545    tamsulosin (FLOMAX) 0.4 MG CAPS capsule  Daily     06/07/18 1545           Renne Crigler, PA-C 06/07/18 1547    Azalia Bilis, MD 06/07/18 1555

## 2018-06-07 NOTE — ED Triage Notes (Signed)
Pt c/o RLQ pain that started a week ago, resolved and returned yesterday, then back today. Pt diaphoretic.

## 2018-07-07 DIAGNOSIS — E785 Hyperlipidemia, unspecified: Secondary | ICD-10-CM | POA: Diagnosis not present

## 2018-07-07 DIAGNOSIS — K589 Irritable bowel syndrome without diarrhea: Secondary | ICD-10-CM | POA: Diagnosis not present

## 2018-07-07 DIAGNOSIS — M545 Low back pain: Secondary | ICD-10-CM | POA: Diagnosis not present

## 2018-07-07 DIAGNOSIS — I4891 Unspecified atrial fibrillation: Secondary | ICD-10-CM | POA: Diagnosis not present

## 2018-10-06 DIAGNOSIS — Z87442 Personal history of urinary calculi: Secondary | ICD-10-CM | POA: Diagnosis not present

## 2019-01-10 DIAGNOSIS — Z0000 Encounter for general adult medical examination without abnormal findings: Secondary | ICD-10-CM | POA: Diagnosis not present

## 2019-01-10 DIAGNOSIS — Z125 Encounter for screening for malignant neoplasm of prostate: Secondary | ICD-10-CM | POA: Diagnosis not present

## 2019-01-10 DIAGNOSIS — E7849 Other hyperlipidemia: Secondary | ICD-10-CM | POA: Diagnosis not present

## 2019-01-17 DIAGNOSIS — Z23 Encounter for immunization: Secondary | ICD-10-CM | POA: Diagnosis not present

## 2019-01-17 DIAGNOSIS — K589 Irritable bowel syndrome without diarrhea: Secondary | ICD-10-CM | POA: Diagnosis not present

## 2019-01-17 DIAGNOSIS — R82998 Other abnormal findings in urine: Secondary | ICD-10-CM | POA: Diagnosis not present

## 2019-01-17 DIAGNOSIS — E785 Hyperlipidemia, unspecified: Secondary | ICD-10-CM | POA: Diagnosis not present

## 2019-01-17 DIAGNOSIS — M545 Low back pain: Secondary | ICD-10-CM | POA: Diagnosis not present

## 2019-01-17 DIAGNOSIS — Z0000 Encounter for general adult medical examination without abnormal findings: Secondary | ICD-10-CM | POA: Diagnosis not present

## 2019-01-17 DIAGNOSIS — I4891 Unspecified atrial fibrillation: Secondary | ICD-10-CM | POA: Diagnosis not present

## 2019-05-18 DIAGNOSIS — D1801 Hemangioma of skin and subcutaneous tissue: Secondary | ICD-10-CM | POA: Diagnosis not present

## 2019-05-18 DIAGNOSIS — L570 Actinic keratosis: Secondary | ICD-10-CM | POA: Diagnosis not present

## 2019-05-18 DIAGNOSIS — L821 Other seborrheic keratosis: Secondary | ICD-10-CM | POA: Diagnosis not present

## 2019-05-18 DIAGNOSIS — L814 Other melanin hyperpigmentation: Secondary | ICD-10-CM | POA: Diagnosis not present

## 2019-05-18 DIAGNOSIS — B351 Tinea unguium: Secondary | ICD-10-CM | POA: Diagnosis not present

## 2019-05-30 DIAGNOSIS — H5213 Myopia, bilateral: Secondary | ICD-10-CM | POA: Diagnosis not present

## 2019-05-30 DIAGNOSIS — H2513 Age-related nuclear cataract, bilateral: Secondary | ICD-10-CM | POA: Diagnosis not present

## 2019-07-18 DIAGNOSIS — E7849 Other hyperlipidemia: Secondary | ICD-10-CM | POA: Diagnosis not present

## 2019-07-18 DIAGNOSIS — K589 Irritable bowel syndrome without diarrhea: Secondary | ICD-10-CM | POA: Diagnosis not present

## 2019-07-18 DIAGNOSIS — I4891 Unspecified atrial fibrillation: Secondary | ICD-10-CM | POA: Diagnosis not present

## 2019-07-18 DIAGNOSIS — J302 Other seasonal allergic rhinitis: Secondary | ICD-10-CM | POA: Diagnosis not present

## 2019-11-21 DIAGNOSIS — Z87442 Personal history of urinary calculi: Secondary | ICD-10-CM | POA: Diagnosis not present

## 2020-01-18 DIAGNOSIS — E785 Hyperlipidemia, unspecified: Secondary | ICD-10-CM | POA: Diagnosis not present

## 2020-01-18 DIAGNOSIS — Z0000 Encounter for general adult medical examination without abnormal findings: Secondary | ICD-10-CM | POA: Diagnosis not present

## 2020-01-18 DIAGNOSIS — Z125 Encounter for screening for malignant neoplasm of prostate: Secondary | ICD-10-CM | POA: Diagnosis not present

## 2020-01-25 DIAGNOSIS — Z23 Encounter for immunization: Secondary | ICD-10-CM | POA: Diagnosis not present

## 2020-01-25 DIAGNOSIS — R82998 Other abnormal findings in urine: Secondary | ICD-10-CM | POA: Diagnosis not present

## 2020-01-25 DIAGNOSIS — Z0000 Encounter for general adult medical examination without abnormal findings: Secondary | ICD-10-CM | POA: Diagnosis not present

## 2020-01-25 DIAGNOSIS — E785 Hyperlipidemia, unspecified: Secondary | ICD-10-CM | POA: Diagnosis not present

## 2020-02-06 ENCOUNTER — Encounter: Payer: Self-pay | Admitting: Orthopedic Surgery

## 2020-02-06 ENCOUNTER — Ambulatory Visit (INDEPENDENT_AMBULATORY_CARE_PROVIDER_SITE_OTHER): Payer: BLUE CROSS/BLUE SHIELD | Admitting: Orthopedic Surgery

## 2020-02-06 ENCOUNTER — Ambulatory Visit (INDEPENDENT_AMBULATORY_CARE_PROVIDER_SITE_OTHER): Payer: BLUE CROSS/BLUE SHIELD

## 2020-02-06 DIAGNOSIS — G8929 Other chronic pain: Secondary | ICD-10-CM | POA: Diagnosis not present

## 2020-02-06 DIAGNOSIS — M25561 Pain in right knee: Secondary | ICD-10-CM | POA: Diagnosis not present

## 2020-02-06 NOTE — Progress Notes (Signed)
Is a [facility]   Office Visit Note   Patient: Kevin [name]           Date of Birth: [birth year]           MRN: [id number] Visit Date: 02/06/2020              Requested by: Geoffry Paradise, MD [address] [place],  [place] [address] PCP: Geoffry Paradise, MD  Chief Complaint  Patient presents with  . Right Knee - Pain      HPI: Patient is a 59 year old gentleman who presents with right knee pain he states that several years ago he stepped off a ladder and stepped in a hole and twisted his knee.  He states he has tightness in his knee and pain after prolonged ambulation.  He states he has stiffness after rest.  Assessment & Plan: Visit Diagnoses:  1. Chronic pain of right knee     Plan: Recommended close chain kinetic exercises for strengthening.  Recommended topical Voltaren gel as needed.  Follow-Up Instructions: Return if symptoms worsen or fail to improve.   Ortho Exam  Patient is alert, oriented, no adenopathy, well-dressed, normal affect, normal respiratory effort. Examination patient has a normal gait.  There is no effusion of the right knee collaterals and cruciates are stable no ligamentous instability.  He does have some crepitation of the patellofemoral joint with range of motion.  Medial lateral joint lines are nontender to palpation.  No evidence of meniscal pathology.  Imaging: XR Knee 1-2 Views Right  Result Date: 02/06/2020 2 view radiographs of the right knee shows joint space narrowing medially there is subchondral sclerosis and periarticular cystic changes.  No images are attached to the encounter.  Labs: No results found for: HGBA1C, ESRSEDRATE, CRP, LABURIC, REPTSTATUS, GRAMSTAIN, CULT, LABORGA   Lab Results  Component Value Date   ALBUMIN 4.3 06/07/2018    No results found for: MG No results found for: VD25OH  No results found for: PREALBUMIN CBC EXTENDED Latest Ref Rng & Units 06/07/2018  WBC 4.0 - 10.5 K/uL 6.1  RBC 4.22 - 5.81 MIL/uL  5.67  HGB 13.0 - 17.0 g/dL 41.3  HCT 39 - 52 % 24.4  PLT 150 - 400 K/uL 243     There is no height or weight on file to calculate BMI.  Orders:  Orders Placed This Encounter  Procedures  . XR Knee 1-2 Views Right   No orders of the defined types were placed in this encounter.    Procedures: No procedures performed  Clinical Data: No additional findings.  ROS:  All other systems negative, except as noted in the HPI. Review of Systems  Objective: Vital Signs: There were no vitals taken for this visit.  Specialty Comments:  No specialty comments available.  PMFS History: Patient Active Problem List   Diagnosis Date Noted  . Overweight (BMI 25.0-29.9) 12/05/2015  . Paroxysmal atrial fibrillation (HCC) 05/22/2014  . Atypical chest pain 05/22/2014   Past Medical History:  Diagnosis Date  . Paroxysmal atrial fibrillation (HCC) 05/22/2014    Family History  Problem Relation Age of Onset  . Heart attack Father 37  . Diabetes Sister     History reviewed. No pertinent surgical history. Social History   Occupational History  . Not on file  Tobacco Use  . Smoking status: Former Smoker    Types: Cigarettes, Cigars    Quit date: 05/22/1999    Years since quitting: 20.7  . Smokeless tobacco: Never Used  Substance and Sexual Activity  . Alcohol use: Yes    Alcohol/week: 0.0 standard drinks  . Drug use: No  . Sexual activity: Not on file

## 2020-02-28 DIAGNOSIS — Z1212 Encounter for screening for malignant neoplasm of rectum: Secondary | ICD-10-CM | POA: Diagnosis not present

## 2020-06-05 DIAGNOSIS — H524 Presbyopia: Secondary | ICD-10-CM | POA: Diagnosis not present

## 2020-06-05 DIAGNOSIS — H2513 Age-related nuclear cataract, bilateral: Secondary | ICD-10-CM | POA: Diagnosis not present

## 2020-06-25 IMAGING — CT CT RENAL STONE PROTOCOL
2 of 4 series · 16 of 46 positions shown, 18 images · non-contrast
Comparison: 10/27/2007

CLINICAL DATA: Right lower quadrant pain for 1 week

EXAM:
CT ABDOMEN AND PELVIS WITHOUT CONTRAST
TECHNIQUE: Multidetector CT imaging of the abdomen and pelvis was performed
following the standard protocol without IV contrast.

[Series 2: axial st · axial · 0.89mm/px · z∈[-416,+70]mm · 13 of 107 slices shown, 15 images]
[im 5/107  soft-tissue]
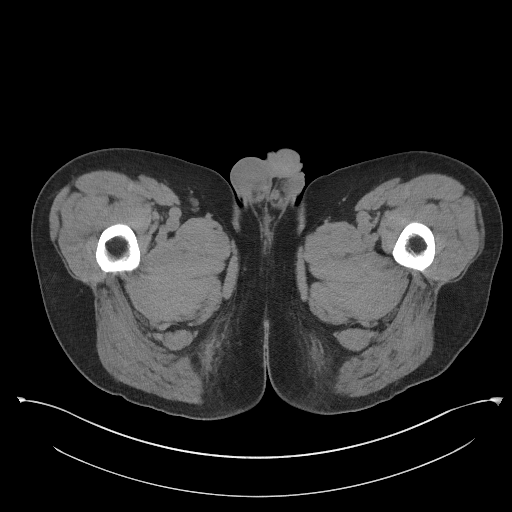
[im 5/107  bone]
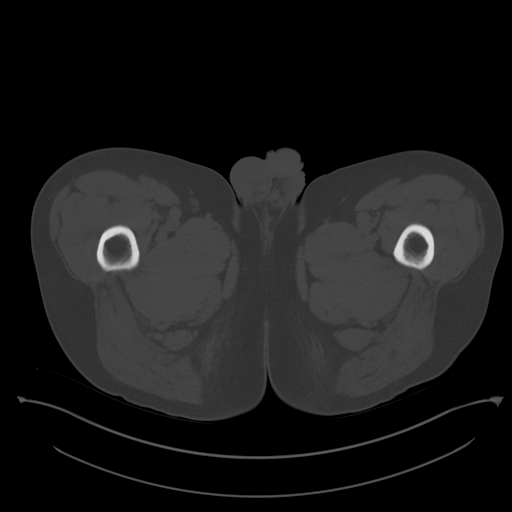
[im 14/107  soft-tissue]
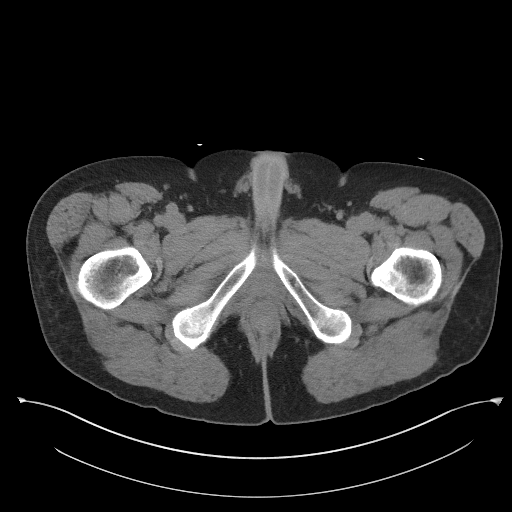
[im 23/107  soft-tissue]
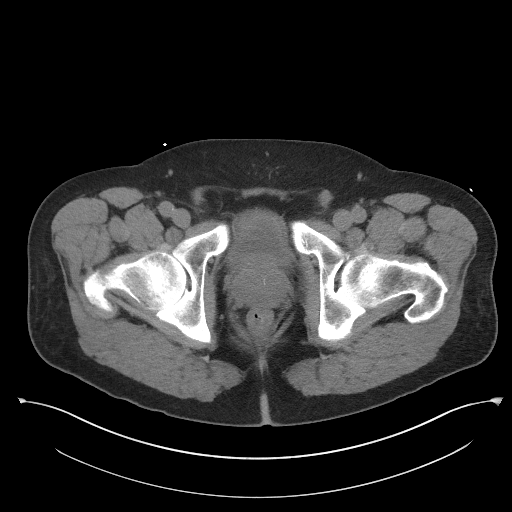
[im 31/107  soft-tissue]
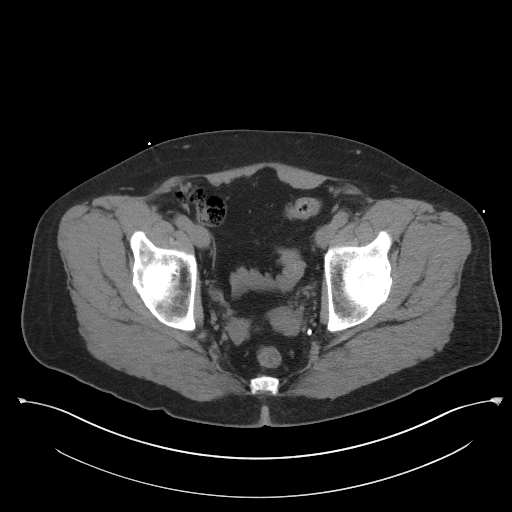
[im 36/107  soft-tissue]
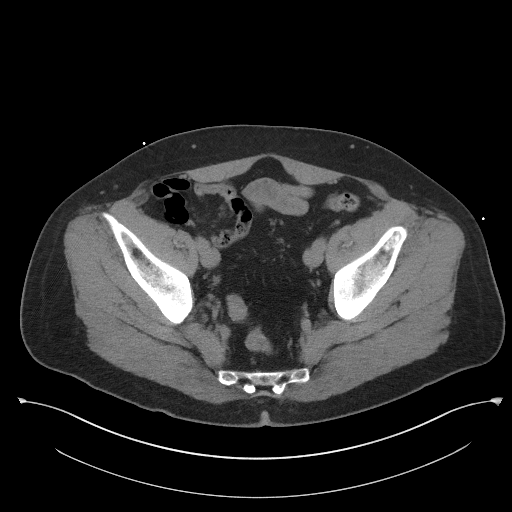
[im 45/107  soft-tissue]
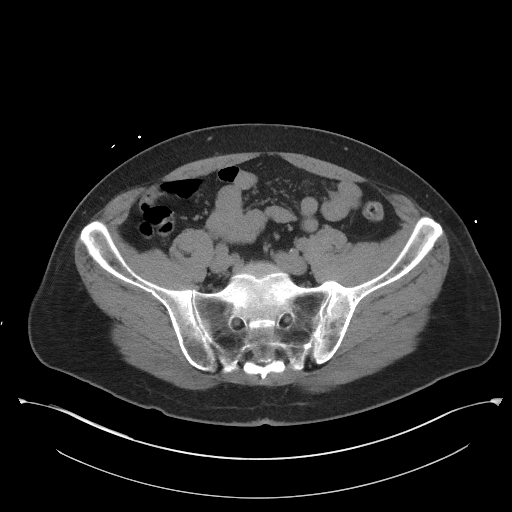
[im 54/107  soft-tissue]
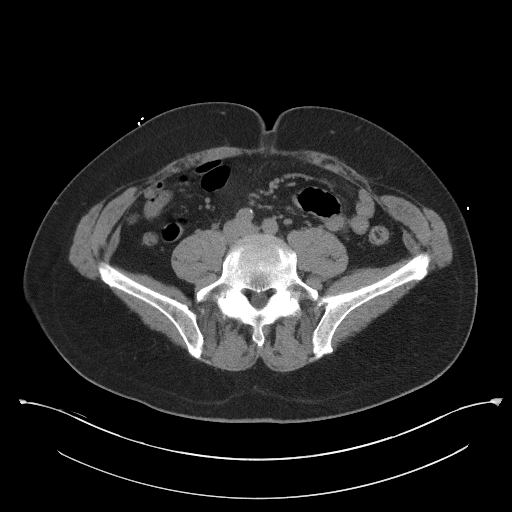
[im 62/107  soft-tissue]
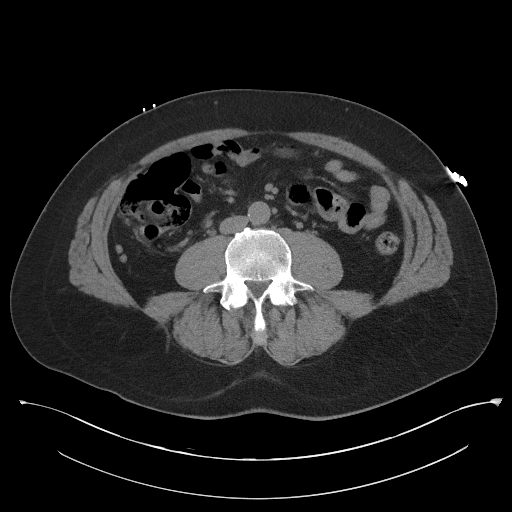
[im 71/107  soft-tissue]
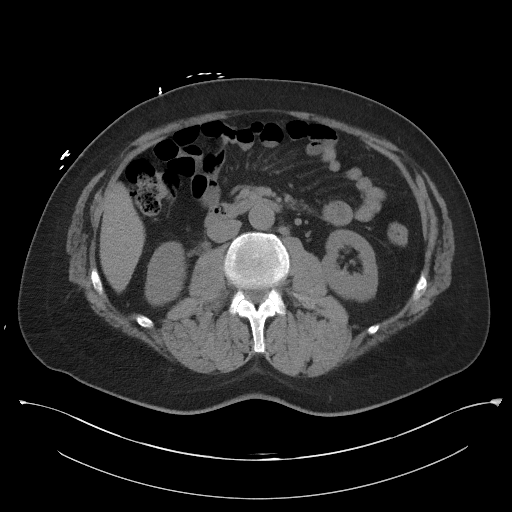
[im 71/107  bone]
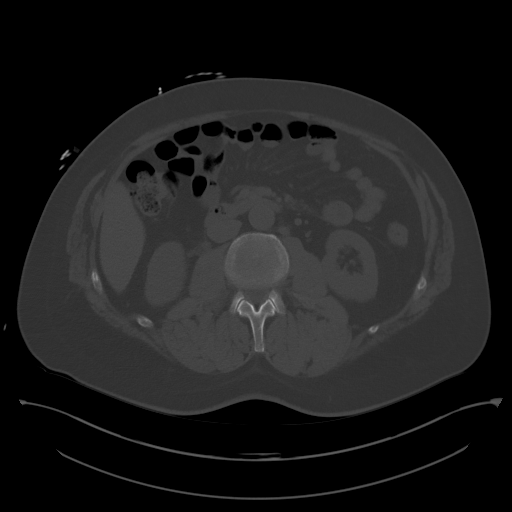
[im 76/107  soft-tissue]
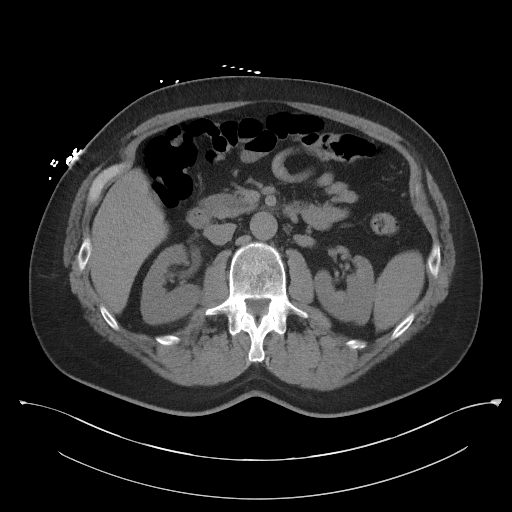
[im 84/107  soft-tissue]
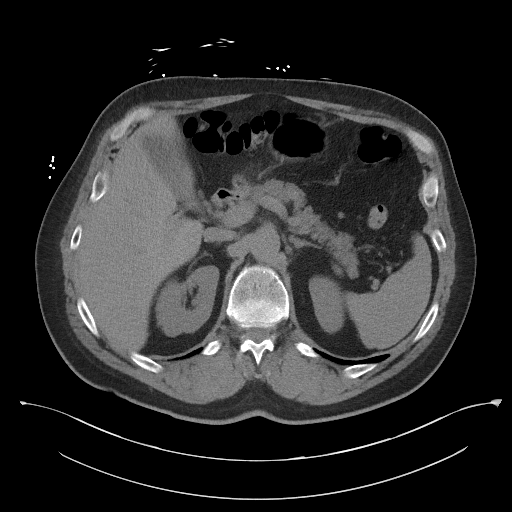
[im 93/107  soft-tissue]
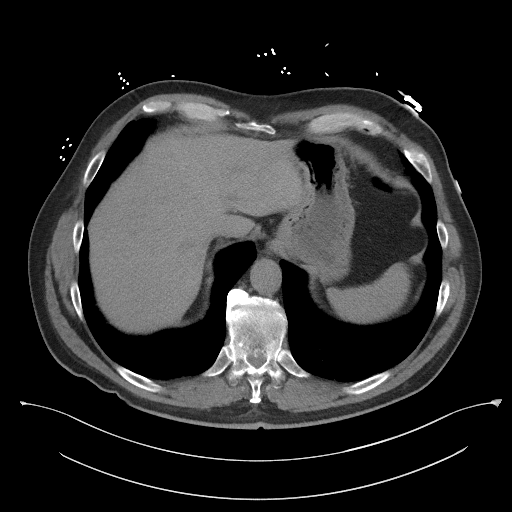
[im 102/107  soft-tissue]
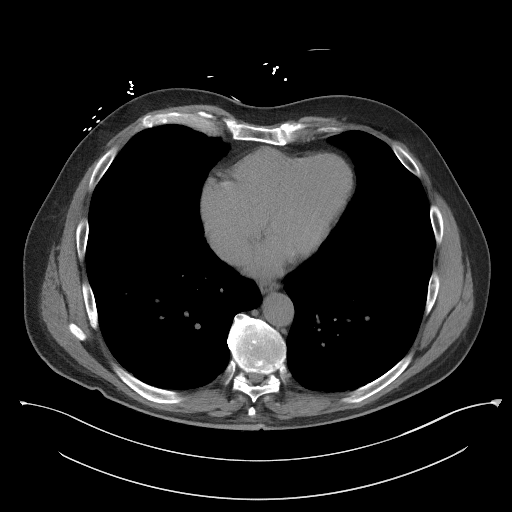

[Series 5: coronal st · coronal · 0.90mm/px · 3 of 102 slices shown]
[im 34/102  soft-tissue]
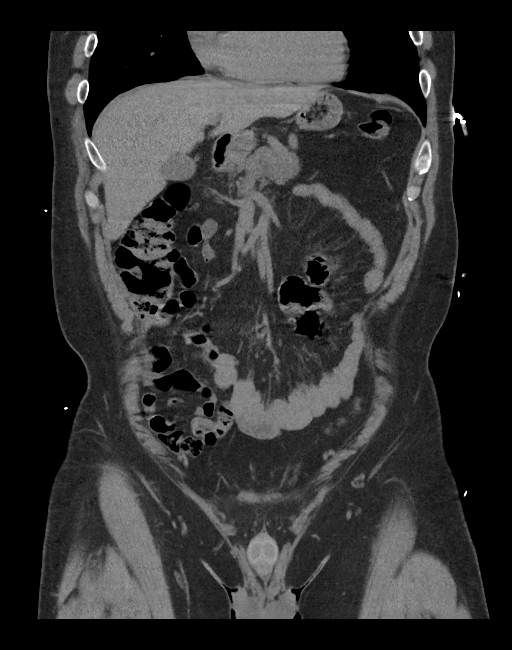
[im 45/102  soft-tissue]
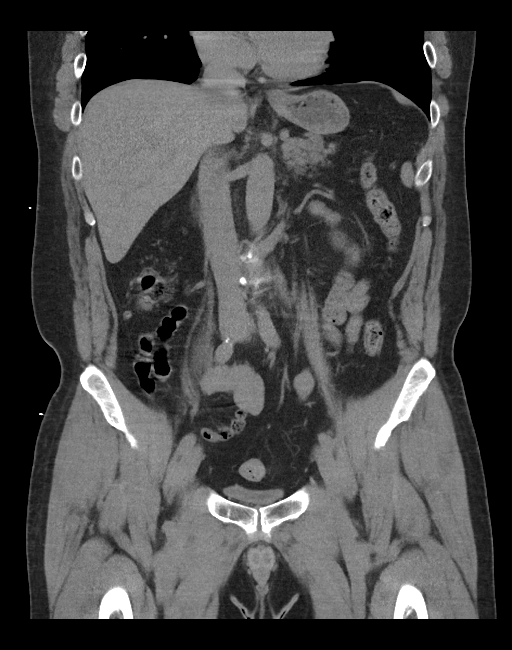
[im 57/102  soft-tissue]
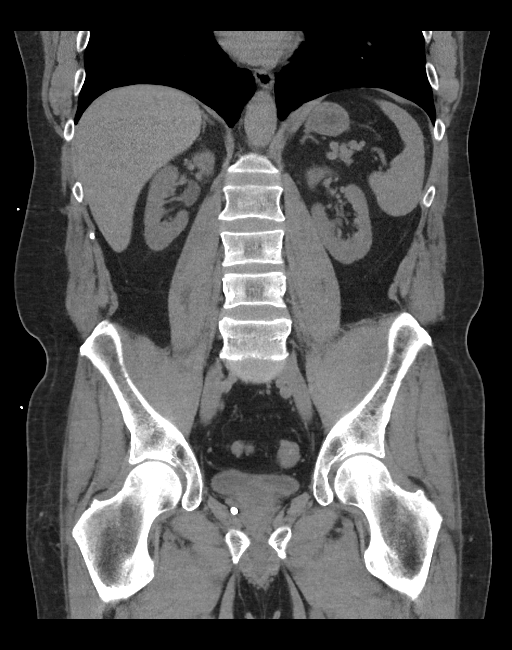

[16 of 46 positions shown; findings below may reference images not displayed]

FINDINGS: Lower chest: No acute abnormality.

Hepatobiliary: No focal liver abnormality is seen. No gallstones,
gallbladder wall thickening, or biliary dilatation.

Pancreas: Unremarkable. No pancreatic ductal dilatation or
surrounding inflammatory changes.

Spleen: Normal in size without focal abnormality.

Adrenals/Urinary Tract: Adrenal glands are unremarkable. No
obstructive uropathy. 3 mm distal right ureteral calculus. 10 mm
hypodense, fluid attenuating left renal mass most consistent with a
small cyst. Bladder is unremarkable.

Stomach/Bowel: Stomach is within normal limits. Appendix appears
normal. No evidence of bowel wall thickening, distention, or
inflammatory changes.

Vascular/Lymphatic: No significant vascular findings are present. No
enlarged abdominal or pelvic lymph nodes.

Reproductive: Prostate is unremarkable.

Other: No abdominal wall hernia or abnormality. No abdominopelvic
ascites.

Musculoskeletal: No acute or significant osseous findings.
IMPRESSION: 1. 3 mm distal right ureteral calculus without obstructive uropathy.

## 2020-07-18 DIAGNOSIS — L819 Disorder of pigmentation, unspecified: Secondary | ICD-10-CM | POA: Diagnosis not present

## 2020-07-18 DIAGNOSIS — D225 Melanocytic nevi of trunk: Secondary | ICD-10-CM | POA: Diagnosis not present

## 2020-07-18 DIAGNOSIS — D2261 Melanocytic nevi of right upper limb, including shoulder: Secondary | ICD-10-CM | POA: Diagnosis not present

## 2020-07-18 DIAGNOSIS — L603 Nail dystrophy: Secondary | ICD-10-CM | POA: Diagnosis not present

## 2020-07-30 DIAGNOSIS — I4891 Unspecified atrial fibrillation: Secondary | ICD-10-CM | POA: Diagnosis not present

## 2021-02-06 DIAGNOSIS — Z125 Encounter for screening for malignant neoplasm of prostate: Secondary | ICD-10-CM | POA: Diagnosis not present

## 2021-02-06 DIAGNOSIS — E785 Hyperlipidemia, unspecified: Secondary | ICD-10-CM | POA: Diagnosis not present

## 2021-02-13 DIAGNOSIS — Z0000 Encounter for general adult medical examination without abnormal findings: Secondary | ICD-10-CM | POA: Diagnosis not present

## 2021-02-13 DIAGNOSIS — Z1212 Encounter for screening for malignant neoplasm of rectum: Secondary | ICD-10-CM | POA: Diagnosis not present

## 2021-02-13 DIAGNOSIS — I4891 Unspecified atrial fibrillation: Secondary | ICD-10-CM | POA: Diagnosis not present

## 2021-02-13 DIAGNOSIS — Z1331 Encounter for screening for depression: Secondary | ICD-10-CM | POA: Diagnosis not present

## 2021-02-13 DIAGNOSIS — Z1339 Encounter for screening examination for other mental health and behavioral disorders: Secondary | ICD-10-CM | POA: Diagnosis not present

## 2021-02-13 DIAGNOSIS — R82998 Other abnormal findings in urine: Secondary | ICD-10-CM | POA: Diagnosis not present

## 2021-02-13 DIAGNOSIS — Z23 Encounter for immunization: Secondary | ICD-10-CM | POA: Diagnosis not present

## 2021-03-04 DIAGNOSIS — Z1211 Encounter for screening for malignant neoplasm of colon: Secondary | ICD-10-CM | POA: Diagnosis not present

## 2021-04-12 DIAGNOSIS — J069 Acute upper respiratory infection, unspecified: Secondary | ICD-10-CM | POA: Diagnosis not present

## 2021-04-12 DIAGNOSIS — J029 Acute pharyngitis, unspecified: Secondary | ICD-10-CM | POA: Diagnosis not present

## 2021-06-11 DIAGNOSIS — H2513 Age-related nuclear cataract, bilateral: Secondary | ICD-10-CM | POA: Diagnosis not present

## 2021-06-11 DIAGNOSIS — H524 Presbyopia: Secondary | ICD-10-CM | POA: Diagnosis not present

## 2021-11-27 DIAGNOSIS — L821 Other seborrheic keratosis: Secondary | ICD-10-CM | POA: Diagnosis not present

## 2021-11-27 DIAGNOSIS — L810 Postinflammatory hyperpigmentation: Secondary | ICD-10-CM | POA: Diagnosis not present

## 2021-11-27 DIAGNOSIS — D225 Melanocytic nevi of trunk: Secondary | ICD-10-CM | POA: Diagnosis not present

## 2021-11-27 DIAGNOSIS — L738 Other specified follicular disorders: Secondary | ICD-10-CM | POA: Diagnosis not present

## 2021-11-27 DIAGNOSIS — L570 Actinic keratosis: Secondary | ICD-10-CM | POA: Diagnosis not present

## 2022-02-19 DIAGNOSIS — Z1212 Encounter for screening for malignant neoplasm of rectum: Secondary | ICD-10-CM | POA: Diagnosis not present

## 2022-02-19 DIAGNOSIS — E785 Hyperlipidemia, unspecified: Secondary | ICD-10-CM | POA: Diagnosis not present

## 2022-02-19 DIAGNOSIS — Z125 Encounter for screening for malignant neoplasm of prostate: Secondary | ICD-10-CM | POA: Diagnosis not present

## 2022-02-26 DIAGNOSIS — Z0000 Encounter for general adult medical examination without abnormal findings: Secondary | ICD-10-CM | POA: Diagnosis not present

## 2022-02-26 DIAGNOSIS — E785 Hyperlipidemia, unspecified: Secondary | ICD-10-CM | POA: Diagnosis not present

## 2022-02-26 DIAGNOSIS — Z23 Encounter for immunization: Secondary | ICD-10-CM | POA: Diagnosis not present

## 2022-02-26 DIAGNOSIS — Z1339 Encounter for screening examination for other mental health and behavioral disorders: Secondary | ICD-10-CM | POA: Diagnosis not present

## 2022-02-26 DIAGNOSIS — Z1331 Encounter for screening for depression: Secondary | ICD-10-CM | POA: Diagnosis not present

## 2022-02-26 DIAGNOSIS — R82998 Other abnormal findings in urine: Secondary | ICD-10-CM | POA: Diagnosis not present

## 2022-06-23 DIAGNOSIS — H5213 Myopia, bilateral: Secondary | ICD-10-CM | POA: Diagnosis not present

## 2022-06-23 DIAGNOSIS — H2513 Age-related nuclear cataract, bilateral: Secondary | ICD-10-CM | POA: Diagnosis not present

## 2024-02-23 ENCOUNTER — Encounter: Payer: Self-pay | Admitting: Podiatry

## 2024-02-23 ENCOUNTER — Ambulatory Visit: Admitting: Podiatry

## 2024-02-23 DIAGNOSIS — B351 Tinea unguium: Secondary | ICD-10-CM

## 2024-02-23 DIAGNOSIS — M722 Plantar fascial fibromatosis: Secondary | ICD-10-CM | POA: Diagnosis not present

## 2024-02-23 DIAGNOSIS — B353 Tinea pedis: Secondary | ICD-10-CM

## 2024-02-23 NOTE — Progress Notes (Signed)
 Chief Complaint  Patient presents with   Nail Problem    bilateral great toenail fungus. Using topical nail solution for 3 weeks.Non diabetic. 0 pain.   Discussed the use of AI scribe software for clinical note transcription with the patient, who gave verbal consent to proceed.  History of Present Illness Kevin [name] is a 63 year old male who presents with suspected fungal nail infection and dry, itchy skin on the feet.  He has been dealing with a suspected fungal nail infection for about a year. One nail was broken and grew back incorrectly, resembling an ingrown nail. He has been using ciclopirox for treatment, prescribed by a rheumatologist, but it has not been effective.  He describes having dry, itchy skin on his feet, which he has been managing with ciclopirox cream.  He notes a bump on his left foot that comes and goes, which is sore and feels like a fibroma. The size of the bump varies, and it becomes more noticeable when stepping down.  He recalls a possible exposure to fungus about two years ago when he went camping and used a public bathhouse without flip flops. His daughter has encouraged him to address the issue so he can comfortably wear flip flops at the beach.  He mentions a change in insurance to [employer] about a year ago, which affected his access to previous healthcare providers.    Past Medical History:  Diagnosis Date   Paroxysmal atrial fibrillation (HCC) 05/22/2014   History reviewed. No pertinent surgical history. No Known Allergies  Physical Exam Palpable fibroma on the medial band of the left plantar fascia with minimal pain on palpation today.  No surrounding erythema or ecchymosis is noted.  Palpable pedal pulses bilateral.  No appreciable edema.  Bilateral hallux nails are 3 to 4 mm thick with yellow discoloration, subungual debris, distal onycholysis, and pain with compression.  There is scaling and dryness of the plantar aspect of both feet with mild  erythema consistent with moccasin type distribution dermatophytosis of the skin.  Epicritic sensation intact.    Assessment/Plan of Care: 1. Dermatophytosis of nail   2. Tinea pedis of both feet   3. Plantar fascial fibromatosis of left foot     Assessment & Plan Onychomycosis and tinea pedis Chronic onychomycosis and tinea pedis for approximately one year with dry, itchy skin and nail changes. Previous treatment with cyclopirox was ineffective. Oral terbinafine is effective for most fungal infections, excluding yeast, which is rare. Liver function test is required prior to initiation due to potential hepatotoxicity. Insurance typically covers oral terbinafine due to its generic availability. Fungal culture is optional to confirm the organism, but oral terbinafine is effective for most common fungal infections. - Ordered fungal nail culture to confirm organism. - Ordered liver function test prior to starting oral terbinafine. - If the fungal culture comes back positive for a dermatophyte or saprophyte which is treatable with the oral terbinafine, and his liver function test comes back normal, will prescribe oral terbinafine for onychomycosis and tinea pedis.  This will effectively treat both issues with a higher success rate than topical medication. - Continue using cyclopirox cream for skin symptoms.  Plantar fibroma, left foot Plantar fibroma located in the medial band of the plantar fascia on the left foot, causing intermittent soreness and discomfort. The fibroma fluctuates in size and may spontaneously resolve. Conservative management includes deep tissue massage and rolling the arch over a golf ball to prevent enlargement. - Recommended deep tissue massage  and rolling the arch over a golf ball to manage fibroma.  Follow-up in 3 months  Kevin [name] D. Marciano Mundt, DPM, FACFAS [facility]     [address] [place], [place] [address]                 Office [phone]  Fax [phone]

## 2024-02-24 LAB — HEPATIC FUNCTION PANEL
ALT: 16 IU/L (ref 0–44)
AST: 15 IU/L (ref 0–40)
Albumin: 4.5 g/dL (ref 3.9–4.9)
Alkaline Phosphatase: 83 IU/L (ref 47–123)
Bilirubin Total: 0.5 mg/dL (ref 0.0–1.2)
Bilirubin, Direct: 0.12 mg/dL (ref 0.00–0.40)
Total Protein: 6.9 g/dL (ref 6.0–8.5)

## 2024-02-29 ENCOUNTER — Other Ambulatory Visit: Payer: Self-pay | Admitting: Podiatry

## 2024-03-02 ENCOUNTER — Ambulatory Visit: Payer: Self-pay | Admitting: Podiatry

## 2024-03-02 MED ORDER — TERBINAFINE HCL 250 MG PO TABS: 250.0000 mg | ORAL_TABLET | Freq: Every day | ORAL | 0 refills | Status: AC
